# Patient Record
Sex: Male | Born: 1995 | Race: Black or African American | Hispanic: No | Marital: Single | Smoking: Heavy tobacco smoker
Health system: Southern US, Community
[De-identification: ages and names within clinical notes are randomized; demographics above are authoritative.]

## PROBLEM LIST (undated history)

## (undated) ENCOUNTER — Emergency Department (HOSPITAL_COMMUNITY): Discharge status: Against Medical Advice | Payer: BC Managed Care – PPO

## (undated) VITALS — BP 122/85 | HR 105 | Temp 98.1°F | Resp 16 | Ht 66.34 in | Wt 196.2 lb

## (undated) VITALS — BP 106/73 | HR 93 | Temp 98.1°F | Resp 14 | Ht 65.95 in | Wt 147.7 lb

## (undated) VITALS — BP 113/79 | HR 86 | Temp 97.7°F | Resp 16 | Ht 66.34 in | Wt 177.5 lb

## (undated) DIAGNOSIS — F913 Oppositional defiant disorder: Secondary | ICD-10-CM

## (undated) DIAGNOSIS — F329 Major depressive disorder, single episode, unspecified: Secondary | ICD-10-CM

## (undated) DIAGNOSIS — N189 Chronic kidney disease, unspecified: Secondary | ICD-10-CM

## (undated) DIAGNOSIS — F419 Anxiety disorder, unspecified: Secondary | ICD-10-CM

## (undated) DIAGNOSIS — F32A Depression, unspecified: Secondary | ICD-10-CM

## (undated) DIAGNOSIS — F209 Schizophrenia, unspecified: Secondary | ICD-10-CM

## (undated) DIAGNOSIS — R51 Headache: Secondary | ICD-10-CM

## (undated) HISTORY — DX: Chronic kidney disease, unspecified: N18.9

## (undated) HISTORY — DX: Oppositional defiant disorder: F91.3

## (undated) HISTORY — DX: Depression, unspecified: F32.A

## (undated) HISTORY — DX: Major depressive disorder, single episode, unspecified: F32.9

## (undated) HISTORY — DX: Anxiety disorder, unspecified: F41.9

## (undated) HISTORY — PX: URETEROPLASTY: SUR1409

## (undated) HISTORY — DX: Schizophrenia, unspecified: F20.9

## (undated) HISTORY — PX: OTHER SURGICAL HISTORY: SHX169

---
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
## 2015-01-01 ENCOUNTER — Telehealth (HOSPITAL_COMMUNITY): Payer: Self-pay | Admitting: *Deleted

## 2015-01-01 NOTE — Telephone Encounter (Signed)
voice message from mother of patient.   They need a referral from provider in order for Luis Jones to be able to attend a day program.

## 2015-01-01 NOTE — Telephone Encounter (Signed)
Pt mother called and left message for Dr. [name] to call her back to discuss recent issues about pt. Lm for mother to call office back. Number provided.

## 2015-01-05 NOTE — Telephone Encounter (Signed)
Spoke with pt mother and she stated she did not want a ref. Pt is already in a program to help him with not using drugs. Per pt mother, she just needed Dr. [name] to be aware of what pt was doing due to pt not telling the truth when he comes to his f/u with Dr. [name]. Informed pt mother that message was sent earlier to Dr. [name] and she showed understanding

## 2015-01-12 ENCOUNTER — Ambulatory Visit (INDEPENDENT_AMBULATORY_CARE_PROVIDER_SITE_OTHER): Payer: 59 | Admitting: Psychiatry

## 2015-01-12 ENCOUNTER — Encounter (HOSPITAL_COMMUNITY): Payer: Self-pay | Admitting: Psychiatry

## 2015-01-12 DIAGNOSIS — F251 Schizoaffective disorder, depressive type: Secondary | ICD-10-CM

## 2015-01-12 NOTE — Progress Notes (Signed)
          THERAPIST PROGRESS NOTE  Session Time: Monday 01/12/2015  2:15 PM - 3:10 PM  Participation Level: Active  Behavioral Response: Fairly GroomedAlert/angry, depressed  Type of Therapy: Individual Therapy  Treatment Goals addressed:  Improve coping and relaxation techniques, improve efforts regarding self-care  Interventions: Supportive  Summary: Luis Jones is a 19 y.o. male who presents with a history of depression, anxiety, psychotic symptoms,and explosive violent outbursts since April 2013. He's had five psychiatric hospitalizations since that time with the most recent one occuring in December 2014. Most have been preceded by patient becoming very agitated, violent, and experiencing visual and auditory hallucinations (some have been command hallucinations). He also has a history of polysubstance abuse/dependence   Patient last was seen in June 2016. He reports increased stress and depressed mood in recent weeks. He says he and mother had an argument last week about patient not doing more things around the house and mother said very hurtful things. He reports becoming very angry and making disrespectful comments to mother who informed his father about his behavior. Patient now reports tension in the household and not wanting to be around parents for right now. He is contemplating talking to them about staying with his grandfather for a few days. He admits being very hurt by mother's comments. He reports increased auditory hallucinations but denies any command hallucinations. He denies any suicidal ideations but admits he had thoughts of cutting self but was able to refrain. He still likes being in the mentor program and being around his brother. He reports very little involvement in other activities. He still has erratic sleep schedule. He reports he has been trying to get involved in his music and write more songs.  Suicidal/Homicidal: No  Therapist Response: Therapist works  with patient to identify and verbalize feelings, identify underlying feelings beneath anger, review relaxation techniques, identify ways to express concerns to parents and do a role play, identify areas patient can change, identify ways to increase activity and structure, identify ways to improve sleep hygiene,  Plan: Return again in 3-4  Weeks. Patient agrees to use daily planner and bring forms to next session.   Diagnosis: Axis I: Schizoaffective Disorder    Axis II: No diagnosis    Luis Saks, LCSW 01/12/2015

## 2015-01-12 NOTE — Patient Instructions (Signed)
Discussed orally 

## 2015-02-04 ENCOUNTER — Ambulatory Visit (HOSPITAL_COMMUNITY): Payer: Self-pay | Admitting: Psychiatry

## 2015-02-09 ENCOUNTER — Ambulatory Visit (HOSPITAL_COMMUNITY): Payer: Self-pay | Admitting: Psychiatry

## 2015-02-27 ENCOUNTER — Ambulatory Visit (INDEPENDENT_AMBULATORY_CARE_PROVIDER_SITE_OTHER): Payer: 59 | Admitting: Psychiatry

## 2015-02-27 ENCOUNTER — Encounter (HOSPITAL_COMMUNITY): Payer: Self-pay | Admitting: Psychiatry

## 2015-02-27 VITALS — BP 136/74 | HR 93 | Ht 67.0 in | Wt 236.4 lb

## 2015-02-27 DIAGNOSIS — F251 Schizoaffective disorder, depressive type: Secondary | ICD-10-CM | POA: Diagnosis not present

## 2015-02-27 MED ORDER — GABAPENTIN 400 MG PO CAPS: ORAL_CAPSULE | ORAL | Status: DC

## 2015-02-27 MED ORDER — HYDROXYZINE HCL 25 MG PO TABS: 25.0000 mg | ORAL_TABLET | Freq: Every day | ORAL | Status: DC

## 2015-02-27 MED ORDER — OLANZAPINE 10 MG PO TABS: 10.0000 mg | ORAL_TABLET | Freq: Every day | ORAL | Status: DC

## 2015-02-27 MED ORDER — VENLAFAXINE HCL ER 150 MG PO CP24: 150.0000 mg | ORAL_CAPSULE | Freq: Every day | ORAL | Status: DC

## 2015-02-27 MED ORDER — TRAZODONE HCL 100 MG PO TABS: ORAL_TABLET | ORAL | Status: DC
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
## 2015-05-01 ENCOUNTER — Ambulatory Visit (HOSPITAL_COMMUNITY): Payer: Self-pay | Admitting: Psychiatry

## 2015-05-05 ENCOUNTER — Telehealth (HOSPITAL_COMMUNITY): Payer: Self-pay | Admitting: *Deleted

## 2015-06-05 ENCOUNTER — Ambulatory Visit (INDEPENDENT_AMBULATORY_CARE_PROVIDER_SITE_OTHER): Payer: 59 | Admitting: Psychiatry

## 2015-06-05 ENCOUNTER — Encounter (HOSPITAL_COMMUNITY): Payer: Self-pay | Admitting: Psychiatry

## 2015-06-05 DIAGNOSIS — F251 Schizoaffective disorder, depressive type: Secondary | ICD-10-CM
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
## 2015-06-05 NOTE — Patient Instructions (Signed)
Discussed orally 

## 2015-06-12 ENCOUNTER — Ambulatory Visit (HOSPITAL_COMMUNITY): Payer: Self-pay | Admitting: Psychiatry

## 2015-06-13 ENCOUNTER — Other Ambulatory Visit (HOSPITAL_COMMUNITY): Payer: Self-pay | Admitting: Psychiatry

## 2015-06-15 ENCOUNTER — Telehealth (HOSPITAL_COMMUNITY): Payer: Self-pay | Admitting: *Deleted

## 2015-06-15 ENCOUNTER — Other Ambulatory Visit (HOSPITAL_COMMUNITY): Payer: Self-pay | Admitting: Psychiatry

## 2015-06-15 MED ORDER — HYDROXYZINE HCL 25 MG PO TABS: 25.0000 mg | ORAL_TABLET | Freq: Every day | ORAL | Status: DC

## 2015-06-15 MED ORDER — VENLAFAXINE HCL ER 150 MG PO CP24: 150.0000 mg | ORAL_CAPSULE | Freq: Every day | ORAL | Status: DC

## 2015-06-15 MED ORDER — TRAZODONE HCL 100 MG PO TABS: ORAL_TABLET | ORAL | Status: DC

## 2015-06-15 MED ORDER — OLANZAPINE 10 MG PO TABS: 10.0000 mg | ORAL_TABLET | Freq: Every day | ORAL | Status: DC

## 2015-06-15 MED ORDER — GABAPENTIN 400 MG PO CAPS: ORAL_CAPSULE | ORAL | Status: DC

## 2015-06-15 NOTE — Telephone Encounter (Signed)
sent 

## 2015-06-15 NOTE — Telephone Encounter (Signed)
Pt mother called stating pt is out of all of his medications. Pt Gabapentin, Hydroxyzine, Olanzapine, Trazodone and Venlafaxine were last filled 02-27-15. Pt appt for 05-01-15 was cancelled and pt f/u appt is scheduled for 06-18-15. Pt number is [phone].

## 2015-06-15 NOTE — Telephone Encounter (Signed)
patient missed appointment on 06/12/15.    phone call from patient's mom.   He is out of his meds.   He need refill of all medications.

## 2015-06-15 NOTE — Telephone Encounter (Signed)
completed

## 2015-06-15 NOTE — Telephone Encounter (Signed)
noted 

## 2015-06-17 ENCOUNTER — Telehealth (HOSPITAL_COMMUNITY): Payer: Self-pay | Admitting: *Deleted

## 2015-06-18 ENCOUNTER — Ambulatory Visit (HOSPITAL_COMMUNITY): Payer: Self-pay | Admitting: Psychiatry

## 2015-07-03 ENCOUNTER — Ambulatory Visit (INDEPENDENT_AMBULATORY_CARE_PROVIDER_SITE_OTHER): Payer: 59 | Admitting: Psychiatry

## 2015-07-03 ENCOUNTER — Encounter (HOSPITAL_COMMUNITY): Payer: Self-pay | Admitting: Psychiatry

## 2015-07-03 DIAGNOSIS — F251 Schizoaffective disorder, depressive type: Secondary | ICD-10-CM
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
## 2015-07-03 NOTE — Patient Instructions (Signed)
Discussed orally 

## 2015-07-10 ENCOUNTER — Encounter (HOSPITAL_COMMUNITY): Payer: Self-pay | Admitting: Psychiatry

## 2015-07-10 ENCOUNTER — Ambulatory Visit (INDEPENDENT_AMBULATORY_CARE_PROVIDER_SITE_OTHER): Payer: 59 | Admitting: Psychiatry

## 2015-07-10 VITALS — BP 132/70 | HR 69 | Ht 67.03 in | Wt 228.8 lb

## 2015-07-10 DIAGNOSIS — F251 Schizoaffective disorder, depressive type: Secondary | ICD-10-CM | POA: Diagnosis not present

## 2015-07-10 MED ORDER — HYDROXYZINE HCL 25 MG PO TABS: 25.0000 mg | ORAL_TABLET | Freq: Every day | ORAL | Status: DC

## 2015-07-10 MED ORDER — OLANZAPINE 10 MG PO TABS: 10.0000 mg | ORAL_TABLET | Freq: Every day | ORAL | Status: DC

## 2015-07-10 MED ORDER — TRAZODONE HCL 100 MG PO TABS: ORAL_TABLET | ORAL | Status: DC

## 2015-07-10 MED ORDER — GABAPENTIN 400 MG PO CAPS: ORAL_CAPSULE | ORAL | Status: DC

## 2015-07-10 MED ORDER — VENLAFAXINE HCL ER 150 MG PO CP24: 150.0000 mg | ORAL_CAPSULE | Freq: Every day | ORAL | Status: DC
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
## 2015-07-31 ENCOUNTER — Encounter (HOSPITAL_COMMUNITY): Payer: Self-pay | Admitting: Psychiatry

## 2015-07-31 ENCOUNTER — Ambulatory Visit (INDEPENDENT_AMBULATORY_CARE_PROVIDER_SITE_OTHER): Payer: 59 | Admitting: Psychiatry

## 2015-07-31 DIAGNOSIS — F251 Schizoaffective disorder, depressive type: Secondary | ICD-10-CM | POA: Diagnosis not present

## 2015-07-31 NOTE — Patient Instructions (Signed)
Discussed orally 
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
## 2015-08-14 ENCOUNTER — Ambulatory Visit (HOSPITAL_COMMUNITY): Payer: Self-pay | Admitting: Psychiatry

## 2015-09-04 ENCOUNTER — Ambulatory Visit (HOSPITAL_COMMUNITY): Payer: Self-pay | Admitting: Psychiatry

## 2015-09-07 ENCOUNTER — Other Ambulatory Visit (HOSPITAL_COMMUNITY): Payer: Self-pay | Admitting: Psychiatry

## 2015-09-18 ENCOUNTER — Encounter (HOSPITAL_COMMUNITY): Payer: Self-pay | Admitting: Psychiatry

## 2015-09-18 ENCOUNTER — Ambulatory Visit (INDEPENDENT_AMBULATORY_CARE_PROVIDER_SITE_OTHER): Payer: 59 | Admitting: Psychiatry

## 2015-09-18 VITALS — BP 110/60 | Ht 67.0 in | Wt 224.0 lb

## 2015-09-18 DIAGNOSIS — F251 Schizoaffective disorder, depressive type: Secondary | ICD-10-CM | POA: Diagnosis not present

## 2015-09-18 MED ORDER — OLANZAPINE 10 MG PO TABS: 10.0000 mg | ORAL_TABLET | Freq: Every day | ORAL | Status: DC

## 2015-09-18 MED ORDER — HYDROXYZINE HCL 25 MG PO TABS: 25.0000 mg | ORAL_TABLET | Freq: Every day | ORAL | Status: DC

## 2015-09-18 MED ORDER — VENLAFAXINE HCL ER 150 MG PO CP24: 150.0000 mg | ORAL_CAPSULE | Freq: Every day | ORAL | Status: DC

## 2015-09-18 MED ORDER — GABAPENTIN 400 MG PO CAPS: ORAL_CAPSULE | ORAL | Status: DC

## 2015-09-18 MED ORDER — TRAZODONE HCL 100 MG PO TABS: ORAL_TABLET | ORAL | Status: DC
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
## 2015-11-03 ENCOUNTER — Ambulatory Visit (INDEPENDENT_AMBULATORY_CARE_PROVIDER_SITE_OTHER): Payer: 59 | Admitting: Psychiatry

## 2015-11-03 ENCOUNTER — Emergency Department (HOSPITAL_COMMUNITY)
Admission: EM | Admit: 2015-11-03 | Discharge: 2015-11-04 | Disposition: A | Discharge status: Home / Self Care | Payer: 59 | Attending: Emergency Medicine | Admitting: Emergency Medicine

## 2015-11-03 ENCOUNTER — Telehealth (HOSPITAL_COMMUNITY): Payer: Self-pay | Admitting: *Deleted

## 2015-11-03 ENCOUNTER — Encounter (HOSPITAL_COMMUNITY): Payer: Self-pay | Admitting: Psychiatry

## 2015-11-03 ENCOUNTER — Encounter (HOSPITAL_COMMUNITY): Payer: Self-pay | Admitting: Emergency Medicine

## 2015-11-03 VITALS — BP 147/70 | HR 74 | Ht 67.0 in | Wt 232.0 lb

## 2015-11-03 DIAGNOSIS — F259 Schizoaffective disorder, unspecified: Secondary | ICD-10-CM | POA: Diagnosis present

## 2015-11-03 DIAGNOSIS — R45851 Suicidal ideations: Secondary | ICD-10-CM

## 2015-11-03 DIAGNOSIS — Z79899 Other long term (current) drug therapy: Secondary | ICD-10-CM | POA: Insufficient documentation

## 2015-11-03 DIAGNOSIS — F17210 Nicotine dependence, cigarettes, uncomplicated: Secondary | ICD-10-CM | POA: Insufficient documentation

## 2015-11-03 DIAGNOSIS — F209 Schizophrenia, unspecified: Secondary | ICD-10-CM | POA: Insufficient documentation

## 2015-11-03 DIAGNOSIS — F251 Schizoaffective disorder, depressive type: Secondary | ICD-10-CM | POA: Diagnosis not present

## 2015-11-03 DIAGNOSIS — F329 Major depressive disorder, single episode, unspecified: Secondary | ICD-10-CM | POA: Insufficient documentation

## 2015-11-03 DIAGNOSIS — N189 Chronic kidney disease, unspecified: Secondary | ICD-10-CM | POA: Diagnosis not present

## 2015-11-03 LAB — COMPREHENSIVE METABOLIC PANEL
ALT: 30 U/L (ref 17–63)
ANION GAP: 10 (ref 5–15)
AST: 26 U/L (ref 15–41)
Albumin: 4 g/dL (ref 3.5–5.0)
Alkaline Phosphatase: 93 U/L (ref 38–126)
BUN: 12 mg/dL (ref 6–20)
CALCIUM: 8.9 mg/dL (ref 8.9–10.3)
CHLORIDE: 108 mmol/L (ref 101–111)
CO2: 23 mmol/L (ref 22–32)
Creatinine, Ser: 1.58 mg/dL — ABNORMAL HIGH (ref 0.61–1.24)
GFR calc non Af Amer: >60 mL/min (ref >60)
Glucose, Bld: 89 mg/dL (ref 65–99)
Potassium: 3.9 mmol/L (ref 3.5–5.1)
SODIUM: 141 mmol/L (ref 135–145)
Total Bilirubin: 1 mg/dL (ref 0.3–1.2)
Total Protein: 7.5 g/dL (ref 6.5–8.1)

## 2015-11-03 LAB — ACETAMINOPHEN LEVEL

## 2015-11-03 LAB — CBC
HCT: 40.1 % (ref 39.0–52.0)
HEMOGLOBIN: 13.9 g/dL (ref 13.0–17.0)
MCH: 31.7 pg (ref 26.0–34.0)
MCHC: 34.7 g/dL (ref 30.0–36.0)
MCV: 91.3 fL (ref 78.0–100.0)
PLATELETS: 348 10*3/uL (ref 150–400)
RBC: 4.39 MIL/uL (ref 4.22–5.81)
RDW: 12.3 % (ref 11.5–15.5)
WBC: 9.8 10*3/uL (ref 4.0–10.5)

## 2015-11-03 LAB — RAPID URINE DRUG SCREEN, HOSP PERFORMED
AMPHETAMINES: NOT DETECTED
BARBITURATES: NOT DETECTED
BENZODIAZEPINES: NOT DETECTED
Cocaine: NOT DETECTED
OPIATES: NOT DETECTED
Tetrahydrocannabinol: NOT DETECTED

## 2015-11-03 LAB — ETHANOL

## 2015-11-03 LAB — SALICYLATE LEVEL

## 2015-11-03 MED ORDER — TRAZODONE HCL 50 MG PO TABS
200.0000 mg | ORAL_TABLET | Freq: Every day | ORAL | Status: DC
  Administered 2015-11-03: 200 mg via ORAL
  Filled 2015-11-03: qty 4

## 2015-11-03 MED ORDER — OLANZAPINE 5 MG PO TABS
10.0000 mg | ORAL_TABLET | Freq: Every day | ORAL | Status: DC
  Administered 2015-11-03: 10 mg via ORAL
  Filled 2015-11-03: qty 2

## 2015-11-03 MED ORDER — GABAPENTIN 400 MG PO CAPS
800.0000 mg | ORAL_CAPSULE | Freq: Every day | ORAL | Status: DC
  Administered 2015-11-03: 800 mg via ORAL
  Filled 2015-11-03: qty 2

## 2015-11-03 MED ORDER — VENLAFAXINE HCL ER 37.5 MG PO CP24
150.0000 mg | ORAL_CAPSULE | Freq: Every day | ORAL | Status: DC
  Administered 2015-11-03 – 2015-11-04 (×2): 150 mg via ORAL
  Filled 2015-11-03 (×2): qty 4

## 2015-11-03 MED ORDER — HYDROXYZINE HCL 25 MG PO TABS
25.0000 mg | ORAL_TABLET | Freq: Every day | ORAL | Status: DC
  Administered 2015-11-03: 25 mg via ORAL
  Filled 2015-11-03: qty 1

## 2015-11-03 MED ORDER — LISINOPRIL 10 MG PO TABS
20.0000 mg | ORAL_TABLET | Freq: Every day | ORAL | Status: DC
  Administered 2015-11-03 – 2015-11-04 (×2): 20 mg via ORAL
  Filled 2015-11-03 (×2): qty 2

## 2015-11-03 MED ORDER — ATORVASTATIN CALCIUM 20 MG PO TABS
20.0000 mg | ORAL_TABLET | Freq: Every day | ORAL | Status: DC
  Administered 2015-11-03: 20 mg via ORAL
  Filled 2015-11-03 (×3): qty 1

## 2015-11-03 NOTE — Telephone Encounter (Signed)
Per Verbal from Dr. Tenny Crawoss to call pt brother (who brought pt to his appt) to [facility]. Per Dr. Tenny Crawoss to call [facility] and let them know that pt is on their way. Spoke with charge nurse Judeth CornfieldStephanie and she showed understanding.

## 2015-11-03 NOTE — ED Notes (Signed)
PT states SI thoughts with no plan that started x2 days ago. PT states he has been taking his medications as prescribed and was sent from Dr. Tenny Crawoss office for ED evaluation. PT also states he hears voices but can't understand what they are saying to him.
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
## 2015-11-03 NOTE — BH Assessment (Signed)
Contacted [facility] and requested machine be placed in patients room. Tele-assessment to commence shortly.   Davina PokeJoVea Nicasio Barlowe, LCSW Therapeutic Triage Specialist [facility] 11/03/2015 7:36 PM

## 2015-11-03 NOTE — ED Provider Notes (Signed)
CSN: [id number]     Arrival date & time 11/03/15  1644 History   First MD Initiated Contact with Patient 11/03/15 1717     Chief Complaint  Patient presents with  . V70.1   HPI Patient presents to the emergency room for evaluation of suicidal ideation. Patient has a history of anxiety and and depression as well as schizophrenia and oppositional defiant disorder. He has had thoughts of suicide for the last couple of days. Patient denies any specific plan and denies any recent attempts. Patient also states he is hearing voices but they not commanding him to do anything. He can't really understand what they're saying. Patient saw his doctor today who sent him to the ED for evaluation. Past Medical History  Diagnosis Date  . Anxiety   . Oppositional defiant disorder   . Depression   . Chronic kidney disease   . Headache(784.0)   . Schizophrenia [facility](HCC)    Past Surgical History  Procedure Laterality Date  . 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 Kideny surgery     Family History  Problem Relation Age of Onset  . Bipolar disorder Mother   . Anxiety disorder Mother   . Seizures Mother   . Autism spectrum disorder Brother   . Drug abuse Maternal Aunt   . Depression Maternal Aunt   . Depression Maternal Uncle   . Bipolar disorder Paternal Aunt   . OCD Father   . ADD / ADHD Neg Hx   . Alcohol abuse Neg Hx   . Dementia Neg Hx   . Paranoid behavior Neg Hx   . Schizophrenia Neg Hx   . Sexual abuse Neg Hx   . Physical abuse Neg Hx    Social History  Substance Use Topics  . Smoking status: Current Every Day Smoker -- 0.25 packs/day    Types: Cigarettes  . Smokeless tobacco: Current User    Types: Chew     Comment: 1 can last 1-2 days as of 09/06/2012  . Alcohol Use: No     Comment: only  drinks occasionally    Review of Systems  All other systems reviewed and are negative.     Allergies  Wellbutrin and Ibuprofen  Home Medications   Prior to Admission  medications   Medication Sig Start Date End Date Taking? Authorizing Provider  atorvastatin (LIPITOR) 20 MG tablet Take 20 mg by mouth daily.    Historical Provider, MD  Ergocalciferol (VITAMIN D2 PO) Take 1.25 mg by mouth as directed.    Historical Provider, MD  gabapentin (NEURONTIN) 400 MG capsule Take 2 at bedtime 09/18/15   Myrlene Brokereborah R Ross, MD  hydrOXYzine (ATARAX/VISTARIL) 25 MG tablet Take 1 tablet (25 mg total) by mouth at bedtime. 09/18/15   Myrlene Brokereborah R Ross, MD  lisinopril (PRINIVIL,ZESTRIL) 20 MG tablet Take 20 mg by mouth daily.    Historical Provider, MD  OLANZapine (ZYPREXA) 10 MG tablet Take 1 tablet (10 mg total) by mouth at bedtime. 09/18/15   Myrlene Brokereborah R Ross, MD  traZODone (DESYREL) 100 MG tablet two at bedtime 09/18/15   Myrlene Brokereborah R Ross, MD  venlafaxine XR (EFFEXOR-XR) 150 MG 24 hr capsule Take 1 capsule (150 mg total) by mouth daily. 09/18/15 09/17/16  Myrlene Brokereborah R Ross, MD   BP 144/75 mmHg  Pulse 71  Temp(Src) 98.3 F (36.8 C) (Oral)  Resp 18  Ht 5\' 7"  (1.702 m)  Wt 105.688 kg  BMI 36.48 kg/m2  SpO2 98% Physical Exam  Constitutional: He appears well-developed and well-nourished. No distress.  HENT:  Head: Normocephalic and atraumatic.  Right Ear: External ear normal.  Left Ear: External ear normal.  Eyes: Conjunctivae are normal. Right eye exhibits no discharge. Left eye exhibits no discharge. No scleral icterus.  Neck: Neck supple. No tracheal deviation present.  Cardiovascular: Normal rate, regular rhythm and intact distal pulses.   Pulmonary/Chest: Effort normal and breath sounds normal. No stridor. No respiratory distress. He has no wheezes. He has no rales.  Abdominal: Soft. Bowel sounds are normal. He exhibits no distension. There is no tenderness. There is no rebound and no guarding.  Musculoskeletal: He exhibits no edema or tenderness.  Neurological: He is alert. He has normal strength. No cranial nerve deficit (no facial droop, extraocular movements intact, no slurred  speech) or sensory deficit. He exhibits normal muscle tone. He displays no seizure activity. Coordination normal.  Skin: Skin is warm and dry. No rash noted.  Psychiatric: He exhibits a depressed mood. He expresses suicidal ideation. He expresses no suicidal plans and no homicidal plans.  Nursing note and vitals reviewed.   ED Course  Procedures (including critical care time) Labs Review Labs Reviewed  COMPREHENSIVE METABOLIC PANEL - Abnormal; Notable for the following:    Creatinine, Ser 1.58 (*)    All other components within normal limits  ACETAMINOPHEN LEVEL - Abnormal; Notable for the following:    Acetaminophen (Tylenol), Serum <10 (*)    All other components within normal limits  ETHANOL  SALICYLATE LEVEL  CBC  URINE RAPID DRUG SCREEN, HOSP PERFORMED      MDM   Final diagnoses:  Suicidal ideation    Pt presents with complaints of SI.   Pt is calm and cooperative here. Hemodynamicaly stable without acute medical issues. Will consult with psychiatry/TTS    Linwood Dibbles, MD 11/03/15 1820

## 2015-11-03 NOTE — Telephone Encounter (Signed)
Per phone call from pt mother to sch appt due to pt experiencing a lot more depression. Pt is scheduled for 11-03-15

## 2015-11-03 NOTE — ED Notes (Signed)
AC aware lipitor dose not available in ER pyxis. AC reported would bring dose.
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
## 2015-11-03 NOTE — ED Notes (Signed)
Call from Assessment counselor, she has spoken to the PA who suggests that pt be observed throughout the night and be assessed by psychiatry in the morning.

## 2015-11-03 NOTE — ED Notes (Signed)
Behavioral health assessment is completed

## 2015-11-03 NOTE — BH Assessment (Addendum)
Assessment completed. Consulted with Donell SievertSpencer Simon, PA-C who recommends patient be observed overnight and evaluated by psychiatry in the morning. Patients nurse informed of recommendation.   Davina PokeJoVea Tiny Chaudhary, LCSW Therapeutic Triage Specialist [facility] 11/03/2015 8:41 PM

## 2015-11-04 DIAGNOSIS — R45851 Suicidal ideations: Secondary | ICD-10-CM | POA: Diagnosis not present

## 2015-11-04 DIAGNOSIS — F259 Schizoaffective disorder, unspecified: Secondary | ICD-10-CM

## 2015-11-04 MED ORDER — DIVALPROEX SODIUM 500 MG PO DR TAB: 500.0000 mg | DELAYED_RELEASE_TABLET | Freq: Two times a day (BID) | ORAL | Status: DC

## 2015-11-04 MED ORDER — OLANZAPINE 5 MG PO TABS
7.5000 mg | ORAL_TABLET | Freq: Two times a day (BID) | ORAL | Status: DC
  Administered 2015-11-04: 7.5 mg via ORAL
  Filled 2015-11-04: qty 2

## 2015-11-04 MED ORDER — OLANZAPINE 7.5 MG PO TABS
7.5000 mg | ORAL_TABLET | Freq: Two times a day (BID) | ORAL | Status: DC
Start: 2015-11-04 — End: 2016-01-11

## 2015-11-04 MED ORDER — DIVALPROEX SODIUM 250 MG PO DR TAB
500.0000 mg | DELAYED_RELEASE_TABLET | Freq: Two times a day (BID) | ORAL | Status: DC
  Administered 2015-11-04: 500 mg via ORAL
  Filled 2015-11-04: qty 2

## 2015-11-04 NOTE — Discharge Instructions (Signed)
Substance Abuse Treatment Programs ° °Intensive Outpatient Programs °[facility]     °[address]      °[place], [place]                   °[phone]      ° °[facility] °[address] °[place], [place] °[phone] ° °[facility]     °(Inpatient and outpatient)     °[address]           °[phone]   ° °[facility] °[phone] (Suboxone and Methadone) ° °[address]      °[place], [place] [address]      °[phone]      ° °[address] °[place], [place] °852-3033 ° °[facility] (Outpatient/Inpatient, [facility])    °(insurance only) [phone]      °       °[facility] (Groups & Residential) °[place], [place] °[phone] ° °   °[facility]     °[address]     °[place], [place]      °[phone]      ° °[facility] (for caregivers and family) °[address] °[place], [place] °336-299-4655 ° ° ° ° ° °Residential Treatment Programs °[facility]      °[address], [place], [place] [address]  °([phone]      ° °[facility] °[address] [place], [place] [address] °[phone] ° °[facility]        °336-248-8914      ° °[facility] °[phone] ° °[facility]             °[address]                                         °[place], [place]                                                °877-615-2722 or 336-784-9470                              ° °[facility] [place] °[address] °[place], [address] °[phone] ° °[facility]    °[address]      °[place], [place]     °[phone]      ° °[facility] °[address] °[place], [place] °336-285-9073 ° °[facility]   °[address]     °[place], [place] [address]     °[phone]      °Admissions: 8am-3pm M-F ° °[facility] °[address] °[place] °336-227-7417 ° °[facility]: Residential Program (90 Days)   °[place], [place]      °336-725-8389 or 800-758-6077    ° °[facility] °[place], [place] °(Walk in Hours over the weekend or by referral) ° °[facility] °[address], [place],  [address] °([phone] ° °Crisis Mobile: [facility]:  1-877-626-1772 (for crisis response 24 hours a day) °[facility] Hotline:      1-800-256-2452 °Outpatient Psychiatry and Counseling ° °[facility]: Mobile Crisis   Management 24 hours:  1-877-626-1772 ° °[facility] sliding scale fee and walk in schedule: M-F 8am-12pm/1pm-3pm °[address]  °[place], [place] [address] °[phone] ° °[facility] °[address] °[place], [place] [address] °[phone] ° °[facility] ([facility])- new patient walk-in appointments available Monday - Friday 8am -3pm.          °[address] °[place], [place] [address] °336-676-6840 or crisis line- 336-676-6905 ° °[facility] Intensive Outpatient Therapy Program °[address] °[place], [place] [address] °336-832-9804 ° °[facility]                  °Crisis Services      °[phone]      °[address]     °[place], [place] [address]                ° °[facility]   °[facility] °[phone] °[address] °[place], [place] [address] ° ° °[facility]          °[address]  °[place], [place] [address]       °([phone] ° °[facility] °[address] °[place], [place] [address] °336-292-1510 ° °[facility]    °[address]    °[place], [place] [address]     °[phone]      ° °Parish McKinney, MD     °[address]     °[place] [address]     °336-282-1251     °  °[facility] °[address] °[place] [address] ° °[facility]     °[address]     °[place], [place]      °336-542-2076      ° °[facility] °Shamsher Ahluwalia, MD °[address] °[place], [place] [address] °[phone] ° °[facility]     °[address]     °[place], [place] [address]     °336-274-1237      ° °[facility] °[address] °[place], [place] [address] °336-854-4450 °Resources for Temporary Residential Assistance/Crisis Centers ° °DAY CENTERS °[facility] °M-F 8am-3pm   °[address] [place], [place] [address]   336-332-0824 °Services include: laundry, barbering, support groups, case management, phone  & computer access, showers, AA/NA mtgs, mental health/substance abuse nurse, job skills class, disability information, VA assistance, spiritual classes, etc.  ° °HOMELESS SHELTERS ° °[facility]     °[facility]   °[address], [place]     °336.271.5959       °       °[facility] (women and children)       °[address] °[place], [place] [address] °336-275-0820 °[email or web address] for application and process °Application Required ° °[facility]   °[address]    °[place] [address]     °[phone]       °             °[facility] °[address] °[place], [place] [address] °336.273.5572 °336-235-0363(schedule application appt.) °Application Required ° °[facility] (women only)    °[address]     °[place], [place] [address]     °336-884-1039      °  Intake starts 6pm daily °Need valid ID, SSC, & Police report °[facility] [place] °[address] °[place], [place] °336-881-5420 °Application Required ° °[facility] (men only)     °[address]      °[place], [place]     °[phone]      ° °[facility] °(Pregnant women only) °[address] °[place], [place] °336-275-0206 ° °[facility]      °[address]      °[place], [place] [address]     °[phone]      °       °[facility] °[address] °[place], [place] °336-723-1848 °90 day commitment/SA/Application process ° °[facility](men only)     °[address]     °[place], [place]     °[phone]       °Check-in at 7pm     °       °[facility] °[address] °[place], [place] [address] °336-248-6684 °Men/Women/Women and Children must be there by 7 pm ° °[facility] °[place], [place] °336-722-8721                ° °
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
## 2015-11-04 NOTE — ED Notes (Signed)
Patient's mother called and stated that she would not be able to pick up [facility] until 6pm. I informed patient and told him it would be ok for him to wait in the lobby until then. I offered patient something to drink and eat before exiting to lobby. Patient had a lunch tray before discharge.  Security informed of patient waiting in lobby.

## 2015-11-19 ENCOUNTER — Ambulatory Visit (HOSPITAL_COMMUNITY): Payer: Self-pay | Admitting: Psychiatry

## 2015-12-16 ENCOUNTER — Ambulatory Visit (HOSPITAL_COMMUNITY): Payer: Self-pay | Admitting: Psychiatry

## 2015-12-17 ENCOUNTER — Ambulatory Visit (HOSPITAL_COMMUNITY): Payer: Self-pay | Admitting: Psychiatry

## 2016-01-11 ENCOUNTER — Other Ambulatory Visit (HOSPITAL_COMMUNITY): Payer: Self-pay | Admitting: Psychiatry

## 2016-01-15 ENCOUNTER — Other Ambulatory Visit (HOSPITAL_COMMUNITY): Payer: Self-pay | Admitting: Psychiatry

## 2016-01-19 ENCOUNTER — Telehealth (HOSPITAL_COMMUNITY): Payer: Self-pay | Admitting: *Deleted

## 2016-01-19 ENCOUNTER — Other Ambulatory Visit (HOSPITAL_COMMUNITY): Payer: Self-pay | Admitting: Psychiatry

## 2016-01-19 MED ORDER — GABAPENTIN 400 MG PO CAPS
ORAL_CAPSULE | ORAL | 2 refills | Status: DC
Start: 2016-01-19 — End: 2016-02-16

## 2016-01-19 NOTE — Telephone Encounter (Signed)
noted 

## 2016-01-19 NOTE — Telephone Encounter (Signed)
nuerontin sent

## 2016-01-19 NOTE — Telephone Encounter (Signed)
Spoke with pt and informed him refills are at pharmacy and pt verbalized understanding.

## 2016-01-19 NOTE — Telephone Encounter (Signed)
patient need refill of Gabapentin and Trazodone.

## 2016-01-19 NOTE — Telephone Encounter (Signed)
Pt pharmacy requesting refills for pt Trazodone, Olanzapine and Neurontin via e-scribe. Pt Trazodone and Olanzapine last filled on 01-11-2016 with 2 refills but pt Neurontin last filled on 09-18-2015 with 2 refills. Pt f/u appt is 01-27-2016. Pt pharmacy number is [phone].

## 2016-01-27 ENCOUNTER — Encounter (HOSPITAL_COMMUNITY): Payer: Self-pay

## 2016-01-27 ENCOUNTER — Ambulatory Visit (HOSPITAL_COMMUNITY): Payer: Self-pay | Admitting: Psychiatry

## 2016-02-10 ENCOUNTER — Other Ambulatory Visit (HOSPITAL_COMMUNITY): Payer: Self-pay | Admitting: Psychiatry

## 2016-02-16 ENCOUNTER — Encounter (HOSPITAL_COMMUNITY): Payer: Self-pay | Admitting: Psychiatry

## 2016-02-16 ENCOUNTER — Ambulatory Visit (INDEPENDENT_AMBULATORY_CARE_PROVIDER_SITE_OTHER): Payer: 59 | Admitting: Psychiatry

## 2016-02-16 VITALS — BP 144/82 | HR 78 | Ht 67.0 in | Wt 234.0 lb

## 2016-02-16 DIAGNOSIS — F251 Schizoaffective disorder, depressive type: Secondary | ICD-10-CM

## 2016-02-16 MED ORDER — OLANZAPINE 10 MG PO TABS: 10.0000 mg | ORAL_TABLET | Freq: Every day | ORAL | 2 refills | Status: DC

## 2016-02-16 MED ORDER — VENLAFAXINE HCL ER 150 MG PO CP24: 150.0000 mg | ORAL_CAPSULE | Freq: Every day | ORAL | 2 refills | Status: DC

## 2016-02-16 MED ORDER — GABAPENTIN 400 MG PO CAPS: ORAL_CAPSULE | ORAL | 2 refills | Status: DC

## 2016-02-16 MED ORDER — HYDROXYZINE HCL 25 MG PO TABS: 25.0000 mg | ORAL_TABLET | Freq: Every day | ORAL | 2 refills | Status: DC

## 2016-02-16 MED ORDER — TRAZODONE HCL 100 MG PO TABS: 200.0000 mg | ORAL_TABLET | Freq: Every day | ORAL | 2 refills | Status: DC
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
## 2016-04-19 ENCOUNTER — Ambulatory Visit (INDEPENDENT_AMBULATORY_CARE_PROVIDER_SITE_OTHER): Payer: 59 | Admitting: Psychiatry

## 2016-04-19 ENCOUNTER — Encounter (HOSPITAL_COMMUNITY): Payer: Self-pay | Admitting: Psychiatry

## 2016-04-19 VITALS — BP 125/89 | HR 69 | Ht 67.0 in | Wt 233.0 lb

## 2016-04-19 DIAGNOSIS — F251 Schizoaffective disorder, depressive type: Secondary | ICD-10-CM | POA: Diagnosis not present

## 2016-04-19 DIAGNOSIS — Z79899 Other long term (current) drug therapy: Secondary | ICD-10-CM

## 2016-04-19 MED ORDER — TRAZODONE HCL 100 MG PO TABS: 200.0000 mg | ORAL_TABLET | Freq: Every day | ORAL | 2 refills | Status: DC

## 2016-04-19 MED ORDER — GABAPENTIN 400 MG PO CAPS: ORAL_CAPSULE | ORAL | 2 refills | Status: DC

## 2016-04-19 MED ORDER — VENLAFAXINE HCL ER 150 MG PO CP24: 150.0000 mg | ORAL_CAPSULE | Freq: Every day | ORAL | 2 refills | Status: DC

## 2016-04-19 MED ORDER — OLANZAPINE 10 MG PO TABS: 10.0000 mg | ORAL_TABLET | Freq: Every day | ORAL | 2 refills | Status: DC
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
## 2016-05-18 ENCOUNTER — Encounter (HOSPITAL_COMMUNITY): Payer: Self-pay | Admitting: Psychiatry

## 2016-05-18 ENCOUNTER — Encounter (INDEPENDENT_AMBULATORY_CARE_PROVIDER_SITE_OTHER): Payer: Self-pay

## 2016-05-18 ENCOUNTER — Ambulatory Visit (INDEPENDENT_AMBULATORY_CARE_PROVIDER_SITE_OTHER): Payer: 59 | Admitting: Psychiatry

## 2016-05-18 VITALS — BP 137/77 | HR 70 | Ht 67.0 in | Wt 230.4 lb

## 2016-05-18 DIAGNOSIS — F251 Schizoaffective disorder, depressive type: Secondary | ICD-10-CM | POA: Diagnosis not present

## 2016-05-18 DIAGNOSIS — Z79899 Other long term (current) drug therapy: Secondary | ICD-10-CM

## 2016-05-18 MED ORDER — OLANZAPINE 10 MG PO TABS: 10.0000 mg | ORAL_TABLET | Freq: Every day | ORAL | 2 refills | Status: DC

## 2016-05-18 MED ORDER — GABAPENTIN 400 MG PO CAPS: ORAL_CAPSULE | ORAL | 2 refills | Status: DC

## 2016-05-18 MED ORDER — VENLAFAXINE HCL ER 150 MG PO CP24: 150.0000 mg | ORAL_CAPSULE | Freq: Every day | ORAL | 2 refills | Status: DC

## 2016-05-18 MED ORDER — TRAZODONE HCL 100 MG PO TABS: 100.0000 mg | ORAL_TABLET | Freq: Every day | ORAL | 2 refills | Status: DC
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
## 2016-07-01 ENCOUNTER — Ambulatory Visit (INDEPENDENT_AMBULATORY_CARE_PROVIDER_SITE_OTHER): Payer: 59 | Admitting: Psychiatry

## 2016-07-01 ENCOUNTER — Encounter (HOSPITAL_COMMUNITY): Payer: Self-pay | Admitting: Psychiatry

## 2016-07-01 VITALS — BP 164/95 | HR 72 | Ht 67.0 in | Wt 236.0 lb

## 2016-07-01 DIAGNOSIS — F251 Schizoaffective disorder, depressive type: Secondary | ICD-10-CM | POA: Diagnosis not present

## 2016-07-01 DIAGNOSIS — Z79899 Other long term (current) drug therapy: Secondary | ICD-10-CM | POA: Diagnosis not present

## 2016-07-01 MED ORDER — VENLAFAXINE HCL ER 150 MG PO CP24: 150.0000 mg | ORAL_CAPSULE | Freq: Every day | ORAL | 2 refills | Status: DC

## 2016-07-01 MED ORDER — GABAPENTIN 400 MG PO CAPS: ORAL_CAPSULE | ORAL | 2 refills | Status: DC

## 2016-07-01 MED ORDER — OLANZAPINE 10 MG PO TABS: 10.0000 mg | ORAL_TABLET | Freq: Every day | ORAL | 2 refills | Status: DC

## 2016-07-01 MED ORDER — TRAZODONE HCL 100 MG PO TABS: 100.0000 mg | ORAL_TABLET | Freq: Every day | ORAL | 2 refills | Status: DC
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
## 2016-08-26 ENCOUNTER — Encounter (HOSPITAL_COMMUNITY): Payer: Self-pay | Admitting: Psychiatry

## 2016-08-26 ENCOUNTER — Ambulatory Visit (INDEPENDENT_AMBULATORY_CARE_PROVIDER_SITE_OTHER): Payer: 59 | Admitting: Psychiatry

## 2016-08-26 ENCOUNTER — Encounter (INDEPENDENT_AMBULATORY_CARE_PROVIDER_SITE_OTHER): Payer: Self-pay

## 2016-08-26 VITALS — BP 133/72 | HR 80 | Ht 67.0 in | Wt 243.0 lb

## 2016-08-26 DIAGNOSIS — Z810 Family history of intellectual disabilities: Secondary | ICD-10-CM

## 2016-08-26 DIAGNOSIS — Z818 Family history of other mental and behavioral disorders: Secondary | ICD-10-CM | POA: Diagnosis not present

## 2016-08-26 DIAGNOSIS — F251 Schizoaffective disorder, depressive type: Secondary | ICD-10-CM | POA: Diagnosis not present

## 2016-08-26 DIAGNOSIS — Z79899 Other long term (current) drug therapy: Secondary | ICD-10-CM

## 2016-08-26 DIAGNOSIS — Z811 Family history of alcohol abuse and dependence: Secondary | ICD-10-CM

## 2016-08-26 MED ORDER — GABAPENTIN 400 MG PO CAPS: ORAL_CAPSULE | ORAL | 2 refills | Status: DC

## 2016-08-26 MED ORDER — TRAZODONE HCL 100 MG PO TABS: 100.0000 mg | ORAL_TABLET | Freq: Every day | ORAL | 2 refills | Status: DC

## 2016-08-26 MED ORDER — VENLAFAXINE HCL ER 150 MG PO CP24: 300.0000 mg | ORAL_CAPSULE | Freq: Every day | ORAL | 2 refills | Status: DC

## 2016-08-26 MED ORDER — OLANZAPINE 10 MG PO TABS: 15.0000 mg | ORAL_TABLET | Freq: Every day | ORAL | 2 refills | Status: DC
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
## 2016-09-27 ENCOUNTER — Ambulatory Visit (HOSPITAL_COMMUNITY): Payer: Self-pay | Admitting: Psychiatry

## 2016-09-28 ENCOUNTER — Ambulatory Visit (HOSPITAL_COMMUNITY): Payer: Self-pay | Admitting: Psychiatry

## 2016-09-29 ENCOUNTER — Encounter (HOSPITAL_COMMUNITY): Payer: Self-pay | Admitting: Psychiatry

## 2016-09-29 ENCOUNTER — Ambulatory Visit (INDEPENDENT_AMBULATORY_CARE_PROVIDER_SITE_OTHER): Payer: 59 | Admitting: Psychiatry

## 2016-09-29 VITALS — BP 139/84 | HR 72 | Ht 67.0 in | Wt 250.0 lb

## 2016-09-29 DIAGNOSIS — F251 Schizoaffective disorder, depressive type: Secondary | ICD-10-CM | POA: Diagnosis not present

## 2016-09-29 DIAGNOSIS — Z79899 Other long term (current) drug therapy: Secondary | ICD-10-CM

## 2016-09-29 MED ORDER — GABAPENTIN 400 MG PO CAPS: ORAL_CAPSULE | ORAL | 2 refills | Status: DC

## 2016-09-29 MED ORDER — OLANZAPINE 10 MG PO TABS: 15.0000 mg | ORAL_TABLET | Freq: Every day | ORAL | 2 refills | Status: DC

## 2016-09-29 MED ORDER — TRAZODONE HCL 100 MG PO TABS: 100.0000 mg | ORAL_TABLET | Freq: Every day | ORAL | 2 refills | Status: DC

## 2016-09-29 MED ORDER — VENLAFAXINE HCL ER 150 MG PO CP24: 300.0000 mg | ORAL_CAPSULE | Freq: Every day | ORAL | 2 refills | Status: DC
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
## 2016-10-14 ENCOUNTER — Other Ambulatory Visit (HOSPITAL_COMMUNITY): Payer: Self-pay | Admitting: Psychiatry

## 2016-10-18 ENCOUNTER — Other Ambulatory Visit (HOSPITAL_COMMUNITY): Payer: Self-pay | Admitting: Psychiatry

## 2016-10-18 ENCOUNTER — Telehealth (HOSPITAL_COMMUNITY): Payer: Self-pay | Admitting: *Deleted

## 2016-10-18 MED ORDER — GABAPENTIN 400 MG PO CAPS: ORAL_CAPSULE | ORAL | 2 refills | Status: DC

## 2016-10-18 NOTE — Telephone Encounter (Signed)
Sent on 5/17, resent today

## 2016-10-18 NOTE — Telephone Encounter (Signed)
Called pt and lm for pt to call his pharmacy about previous phone call about his medication. Pt name was on voicemail. Office number provided on voicemail if pt have any further questions

## 2016-10-18 NOTE — Telephone Encounter (Signed)
phone call from patient's father, he said pharmacy is requesting a refill on his gabapentin.

## 2016-10-19 ENCOUNTER — Other Ambulatory Visit (HOSPITAL_COMMUNITY): Payer: Self-pay | Admitting: Psychiatry

## 2016-11-29 ENCOUNTER — Encounter (HOSPITAL_COMMUNITY): Payer: Self-pay | Admitting: Psychiatry

## 2016-11-29 ENCOUNTER — Ambulatory Visit (INDEPENDENT_AMBULATORY_CARE_PROVIDER_SITE_OTHER): Payer: 59 | Admitting: Psychiatry

## 2016-11-29 VITALS — BP 131/59 | HR 74 | Ht 67.0 in | Wt 250.0 lb

## 2016-11-29 DIAGNOSIS — Z818 Family history of other mental and behavioral disorders: Secondary | ICD-10-CM | POA: Diagnosis not present

## 2016-11-29 DIAGNOSIS — Z813 Family history of other psychoactive substance abuse and dependence: Secondary | ICD-10-CM | POA: Diagnosis not present

## 2016-11-29 DIAGNOSIS — F251 Schizoaffective disorder, depressive type: Secondary | ICD-10-CM

## 2016-11-29 DIAGNOSIS — Z810 Family history of intellectual disabilities: Secondary | ICD-10-CM | POA: Diagnosis not present

## 2016-11-29 MED ORDER — GABAPENTIN 400 MG PO CAPS: ORAL_CAPSULE | ORAL | 2 refills | Status: DC

## 2016-11-29 MED ORDER — VENLAFAXINE HCL ER 150 MG PO CP24: 300.0000 mg | ORAL_CAPSULE | Freq: Every day | ORAL | 2 refills | Status: DC

## 2016-11-29 MED ORDER — OLANZAPINE 10 MG PO TABS: 15.0000 mg | ORAL_TABLET | Freq: Every day | ORAL | 2 refills | Status: DC

## 2016-11-29 MED ORDER — TRAZODONE HCL 100 MG PO TABS
100.0000 mg | ORAL_TABLET | Freq: Every day | ORAL | 2 refills | Status: DC
Start: 2016-11-29 — End: 2017-01-23
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
## 2017-01-23 ENCOUNTER — Ambulatory Visit (INDEPENDENT_AMBULATORY_CARE_PROVIDER_SITE_OTHER): Payer: 59 | Admitting: Psychiatry

## 2017-01-23 ENCOUNTER — Encounter (HOSPITAL_COMMUNITY): Payer: Self-pay | Admitting: Psychiatry

## 2017-01-23 VITALS — BP 130/70 | Ht 67.0 in | Wt 253.0 lb

## 2017-01-23 DIAGNOSIS — Z818 Family history of other mental and behavioral disorders: Secondary | ICD-10-CM

## 2017-01-23 DIAGNOSIS — F251 Schizoaffective disorder, depressive type: Secondary | ICD-10-CM

## 2017-01-23 DIAGNOSIS — R440 Auditory hallucinations: Secondary | ICD-10-CM

## 2017-01-23 DIAGNOSIS — Z813 Family history of other psychoactive substance abuse and dependence: Secondary | ICD-10-CM | POA: Diagnosis not present

## 2017-01-23 DIAGNOSIS — Z810 Family history of intellectual disabilities: Secondary | ICD-10-CM | POA: Diagnosis not present

## 2017-01-23 MED ORDER — TRAZODONE HCL 100 MG PO TABS: 100.0000 mg | ORAL_TABLET | Freq: Every day | ORAL | 2 refills | Status: DC

## 2017-01-23 MED ORDER — GABAPENTIN 400 MG PO CAPS: ORAL_CAPSULE | ORAL | 2 refills | Status: DC

## 2017-01-23 MED ORDER — VENLAFAXINE HCL ER 150 MG PO CP24: 300.0000 mg | ORAL_CAPSULE | Freq: Every day | ORAL | 2 refills | Status: DC

## 2017-01-23 MED ORDER — OLANZAPINE 20 MG PO TABS: 20.0000 mg | ORAL_TABLET | Freq: Every day | ORAL | 2 refills | Status: DC
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
## 2017-02-20 ENCOUNTER — Encounter (HOSPITAL_COMMUNITY): Payer: Self-pay | Admitting: Psychiatry

## 2017-02-20 ENCOUNTER — Ambulatory Visit (INDEPENDENT_AMBULATORY_CARE_PROVIDER_SITE_OTHER): Payer: 59 | Admitting: Psychiatry

## 2017-02-20 VITALS — BP 140/90 | Ht 67.0 in | Wt 247.0 lb

## 2017-02-20 DIAGNOSIS — F419 Anxiety disorder, unspecified: Secondary | ICD-10-CM | POA: Diagnosis not present

## 2017-02-20 DIAGNOSIS — F251 Schizoaffective disorder, depressive type: Secondary | ICD-10-CM

## 2017-02-20 DIAGNOSIS — G479 Sleep disorder, unspecified: Secondary | ICD-10-CM

## 2017-02-20 DIAGNOSIS — Z813 Family history of other psychoactive substance abuse and dependence: Secondary | ICD-10-CM

## 2017-02-20 DIAGNOSIS — F209 Schizophrenia, unspecified: Secondary | ICD-10-CM

## 2017-02-20 DIAGNOSIS — Z79899 Other long term (current) drug therapy: Secondary | ICD-10-CM | POA: Diagnosis not present

## 2017-02-20 DIAGNOSIS — F329 Major depressive disorder, single episode, unspecified: Secondary | ICD-10-CM | POA: Diagnosis not present

## 2017-02-20 DIAGNOSIS — Z818 Family history of other mental and behavioral disorders: Secondary | ICD-10-CM | POA: Diagnosis not present

## 2017-02-20 MED ORDER — TRAZODONE HCL 100 MG PO TABS: 100.0000 mg | ORAL_TABLET | Freq: Every day | ORAL | 2 refills | Status: DC

## 2017-02-20 MED ORDER — VENLAFAXINE HCL ER 150 MG PO CP24
300.0000 mg | ORAL_CAPSULE | Freq: Every day | ORAL | 2 refills | Status: DC
Start: 2017-02-20 — End: 2017-04-18

## 2017-02-20 MED ORDER — OLANZAPINE 20 MG PO TABS: 20.0000 mg | ORAL_TABLET | Freq: Every day | ORAL | 2 refills | Status: DC

## 2017-02-20 MED ORDER — GABAPENTIN 400 MG PO CAPS: ORAL_CAPSULE | ORAL | 2 refills | Status: DC
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
## 2017-04-18 ENCOUNTER — Encounter (HOSPITAL_COMMUNITY): Payer: Self-pay | Admitting: Psychiatry

## 2017-04-18 ENCOUNTER — Ambulatory Visit (INDEPENDENT_AMBULATORY_CARE_PROVIDER_SITE_OTHER): Payer: 59 | Admitting: Psychiatry

## 2017-04-18 VITALS — BP 138/82 | HR 96 | Ht 67.0 in | Wt 256.0 lb

## 2017-04-18 DIAGNOSIS — Z79899 Other long term (current) drug therapy: Secondary | ICD-10-CM

## 2017-04-18 DIAGNOSIS — Z818 Family history of other mental and behavioral disorders: Secondary | ICD-10-CM

## 2017-04-18 DIAGNOSIS — F251 Schizoaffective disorder, depressive type: Secondary | ICD-10-CM

## 2017-04-18 DIAGNOSIS — F17210 Nicotine dependence, cigarettes, uncomplicated: Secondary | ICD-10-CM | POA: Diagnosis not present

## 2017-04-18 DIAGNOSIS — Z813 Family history of other psychoactive substance abuse and dependence: Secondary | ICD-10-CM

## 2017-04-18 DIAGNOSIS — F17220 Nicotine dependence, chewing tobacco, uncomplicated: Secondary | ICD-10-CM | POA: Diagnosis not present

## 2017-04-18 MED ORDER — GABAPENTIN 400 MG PO CAPS: ORAL_CAPSULE | ORAL | 2 refills | Status: DC

## 2017-04-18 MED ORDER — OLANZAPINE 20 MG PO TABS: 20.0000 mg | ORAL_TABLET | Freq: Every day | ORAL | 2 refills | Status: DC

## 2017-04-18 MED ORDER — TRAZODONE HCL 100 MG PO TABS: 100.0000 mg | ORAL_TABLET | Freq: Every day | ORAL | 2 refills | Status: DC

## 2017-04-18 MED ORDER — VENLAFAXINE HCL ER 150 MG PO CP24
300.0000 mg | ORAL_CAPSULE | Freq: Every day | ORAL | 2 refills | Status: DC
Start: 2017-04-18 — End: 2017-06-20
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
## 2017-06-19 ENCOUNTER — Ambulatory Visit (HOSPITAL_COMMUNITY): Payer: Self-pay | Admitting: Psychiatry

## 2017-06-20 ENCOUNTER — Encounter (HOSPITAL_COMMUNITY): Payer: Self-pay | Admitting: Psychiatry

## 2017-06-20 ENCOUNTER — Ambulatory Visit (INDEPENDENT_AMBULATORY_CARE_PROVIDER_SITE_OTHER): Payer: 59 | Admitting: Psychiatry

## 2017-06-20 VITALS — BP 138/88 | HR 97 | Ht 67.0 in | Wt 255.0 lb

## 2017-06-20 DIAGNOSIS — K219 Gastro-esophageal reflux disease without esophagitis: Secondary | ICD-10-CM | POA: Diagnosis not present

## 2017-06-20 DIAGNOSIS — F251 Schizoaffective disorder, depressive type: Secondary | ICD-10-CM

## 2017-06-20 DIAGNOSIS — F17220 Nicotine dependence, chewing tobacco, uncomplicated: Secondary | ICD-10-CM | POA: Diagnosis not present

## 2017-06-20 DIAGNOSIS — Z818 Family history of other mental and behavioral disorders: Secondary | ICD-10-CM

## 2017-06-20 DIAGNOSIS — Z79899 Other long term (current) drug therapy: Secondary | ICD-10-CM | POA: Diagnosis not present

## 2017-06-20 DIAGNOSIS — Z813 Family history of other psychoactive substance abuse and dependence: Secondary | ICD-10-CM | POA: Diagnosis not present

## 2017-06-20 DIAGNOSIS — F17210 Nicotine dependence, cigarettes, uncomplicated: Secondary | ICD-10-CM

## 2017-06-20 MED ORDER — TRAZODONE HCL 100 MG PO TABS: 100.0000 mg | ORAL_TABLET | Freq: Every day | ORAL | 2 refills | Status: DC

## 2017-06-20 MED ORDER — OLANZAPINE 20 MG PO TABS: 20.0000 mg | ORAL_TABLET | Freq: Every day | ORAL | 2 refills | Status: DC

## 2017-06-20 MED ORDER — GABAPENTIN 400 MG PO CAPS: ORAL_CAPSULE | ORAL | 2 refills | Status: DC

## 2017-06-20 MED ORDER — VENLAFAXINE HCL ER 150 MG PO CP24: 300.0000 mg | ORAL_CAPSULE | Freq: Every day | ORAL | 2 refills | Status: DC
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
## 2017-08-21 ENCOUNTER — Ambulatory Visit (HOSPITAL_COMMUNITY): Payer: Self-pay | Admitting: Psychiatry

## 2017-08-30 ENCOUNTER — Encounter (HOSPITAL_COMMUNITY): Payer: Self-pay | Admitting: Psychiatry

## 2017-08-30 ENCOUNTER — Ambulatory Visit (INDEPENDENT_AMBULATORY_CARE_PROVIDER_SITE_OTHER): Payer: 59 | Admitting: Psychiatry

## 2017-08-30 VITALS — BP 130/92 | Ht 67.0 in | Wt 263.0 lb

## 2017-08-30 DIAGNOSIS — F419 Anxiety disorder, unspecified: Secondary | ICD-10-CM

## 2017-08-30 DIAGNOSIS — Q614 Renal dysplasia: Secondary | ICD-10-CM

## 2017-08-30 DIAGNOSIS — R450 Nervousness: Secondary | ICD-10-CM | POA: Diagnosis not present

## 2017-08-30 DIAGNOSIS — Z813 Family history of other psychoactive substance abuse and dependence: Secondary | ICD-10-CM | POA: Diagnosis not present

## 2017-08-30 DIAGNOSIS — F17210 Nicotine dependence, cigarettes, uncomplicated: Secondary | ICD-10-CM | POA: Diagnosis not present

## 2017-08-30 DIAGNOSIS — F251 Schizoaffective disorder, depressive type: Secondary | ICD-10-CM | POA: Diagnosis not present

## 2017-08-30 DIAGNOSIS — Z810 Family history of intellectual disabilities: Secondary | ICD-10-CM | POA: Diagnosis not present

## 2017-08-30 DIAGNOSIS — Z818 Family history of other mental and behavioral disorders: Secondary | ICD-10-CM | POA: Diagnosis not present

## 2017-08-30 MED ORDER — GABAPENTIN 400 MG PO CAPS: ORAL_CAPSULE | ORAL | 2 refills | Status: DC

## 2017-08-30 MED ORDER — OLANZAPINE 20 MG PO TABS: 20.0000 mg | ORAL_TABLET | Freq: Every day | ORAL | 2 refills | Status: DC

## 2017-08-30 MED ORDER — TRAZODONE HCL 100 MG PO TABS: 100.0000 mg | ORAL_TABLET | Freq: Every day | ORAL | 2 refills | Status: DC

## 2017-08-30 MED ORDER — VENLAFAXINE HCL ER 150 MG PO CP24: 300.0000 mg | ORAL_CAPSULE | Freq: Every day | ORAL | 2 refills | Status: DC
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
## 2017-11-02 ENCOUNTER — Ambulatory Visit (INDEPENDENT_AMBULATORY_CARE_PROVIDER_SITE_OTHER): Payer: 59 | Admitting: Psychiatry

## 2017-11-02 ENCOUNTER — Encounter (HOSPITAL_COMMUNITY): Payer: Self-pay | Admitting: Psychiatry

## 2017-11-02 VITALS — BP 143/90 | HR 69 | Ht 67.0 in | Wt 263.0 lb

## 2017-11-02 DIAGNOSIS — F251 Schizoaffective disorder, depressive type: Secondary | ICD-10-CM

## 2017-11-02 MED ORDER — OLANZAPINE 20 MG PO TABS: 20.0000 mg | ORAL_TABLET | Freq: Every day | ORAL | 2 refills | Status: DC

## 2017-11-02 MED ORDER — TRAZODONE HCL 100 MG PO TABS: 150.0000 mg | ORAL_TABLET | Freq: Every day | ORAL | 2 refills | Status: DC

## 2017-11-02 MED ORDER — VENLAFAXINE HCL ER 150 MG PO CP24: 300.0000 mg | ORAL_CAPSULE | Freq: Every day | ORAL | 2 refills | Status: DC

## 2017-11-02 MED ORDER — GABAPENTIN 400 MG PO CAPS: ORAL_CAPSULE | ORAL | 2 refills | Status: DC
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
## 2017-11-24 ENCOUNTER — Other Ambulatory Visit (HOSPITAL_COMMUNITY): Payer: Self-pay | Admitting: Psychiatry

## 2018-01-02 ENCOUNTER — Encounter (HOSPITAL_COMMUNITY): Payer: Self-pay | Admitting: Psychiatry

## 2018-01-02 ENCOUNTER — Ambulatory Visit (INDEPENDENT_AMBULATORY_CARE_PROVIDER_SITE_OTHER): Payer: 59 | Admitting: Psychiatry

## 2018-01-02 ENCOUNTER — Telehealth (HOSPITAL_COMMUNITY): Payer: Self-pay | Admitting: Psychiatry

## 2018-01-02 VITALS — BP 168/96 | HR 72 | Ht 67.0 in | Wt 252.0 lb

## 2018-01-02 DIAGNOSIS — R5383 Other fatigue: Secondary | ICD-10-CM

## 2018-01-02 DIAGNOSIS — Z813 Family history of other psychoactive substance abuse and dependence: Secondary | ICD-10-CM

## 2018-01-02 DIAGNOSIS — G4700 Insomnia, unspecified: Secondary | ICD-10-CM

## 2018-01-02 DIAGNOSIS — Z818 Family history of other mental and behavioral disorders: Secondary | ICD-10-CM | POA: Diagnosis not present

## 2018-01-02 DIAGNOSIS — R109 Unspecified abdominal pain: Secondary | ICD-10-CM

## 2018-01-02 DIAGNOSIS — Z87891 Personal history of nicotine dependence: Secondary | ICD-10-CM | POA: Diagnosis not present

## 2018-01-02 DIAGNOSIS — F251 Schizoaffective disorder, depressive type: Secondary | ICD-10-CM | POA: Diagnosis not present

## 2018-01-02 DIAGNOSIS — F17220 Nicotine dependence, chewing tobacco, uncomplicated: Secondary | ICD-10-CM

## 2018-01-02 DIAGNOSIS — R110 Nausea: Secondary | ICD-10-CM

## 2018-01-02 MED ORDER — GABAPENTIN 400 MG PO CAPS: ORAL_CAPSULE | ORAL | 2 refills | Status: DC

## 2018-01-02 MED ORDER — VENLAFAXINE HCL ER 150 MG PO CP24: 300.0000 mg | ORAL_CAPSULE | Freq: Every day | ORAL | 2 refills | Status: DC

## 2018-01-02 MED ORDER — OLANZAPINE 20 MG PO TABS: 20.0000 mg | ORAL_TABLET | Freq: Every day | ORAL | 2 refills | Status: DC

## 2018-01-02 MED ORDER — GABAPENTIN 400 MG PO CAPS: 800.0000 mg | ORAL_CAPSULE | Freq: Every day | ORAL | 2 refills | Status: DC

## 2018-01-02 MED ORDER — TRAZODONE HCL 100 MG PO TABS: 150.0000 mg | ORAL_TABLET | Freq: Every day | ORAL | 2 refills | Status: DC
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
## 2018-02-05 ENCOUNTER — Ambulatory Visit (INDEPENDENT_AMBULATORY_CARE_PROVIDER_SITE_OTHER): Payer: 59 | Admitting: Psychiatry

## 2018-02-05 ENCOUNTER — Encounter (HOSPITAL_COMMUNITY): Payer: Self-pay | Admitting: Psychiatry

## 2018-02-05 VITALS — BP 170/108 | HR 75 | Ht 67.0 in | Wt 246.0 lb

## 2018-02-05 DIAGNOSIS — Z810 Family history of intellectual disabilities: Secondary | ICD-10-CM

## 2018-02-05 DIAGNOSIS — Z87891 Personal history of nicotine dependence: Secondary | ICD-10-CM

## 2018-02-05 DIAGNOSIS — Z813 Family history of other psychoactive substance abuse and dependence: Secondary | ICD-10-CM

## 2018-02-05 DIAGNOSIS — F251 Schizoaffective disorder, depressive type: Secondary | ICD-10-CM

## 2018-02-05 DIAGNOSIS — Z818 Family history of other mental and behavioral disorders: Secondary | ICD-10-CM | POA: Diagnosis not present

## 2018-02-05 DIAGNOSIS — G4700 Insomnia, unspecified: Secondary | ICD-10-CM

## 2018-02-05 MED ORDER — GABAPENTIN 400 MG PO CAPS: 800.0000 mg | ORAL_CAPSULE | Freq: Every day | ORAL | 2 refills | Status: DC

## 2018-02-05 MED ORDER — VENLAFAXINE HCL ER 150 MG PO CP24: 300.0000 mg | ORAL_CAPSULE | Freq: Every day | ORAL | 2 refills | Status: DC

## 2018-02-05 MED ORDER — OLANZAPINE 20 MG PO TABS: 20.0000 mg | ORAL_TABLET | Freq: Every day | ORAL | 2 refills | Status: DC

## 2018-02-05 MED ORDER — HYDROXYZINE HCL 50 MG PO TABS: 50.0000 mg | ORAL_TABLET | Freq: Every day | ORAL | 0 refills | Status: DC
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
## 2018-03-04 ENCOUNTER — Other Ambulatory Visit (HOSPITAL_COMMUNITY): Payer: Self-pay | Admitting: Psychiatry

## 2018-03-11 ENCOUNTER — Other Ambulatory Visit (HOSPITAL_COMMUNITY): Payer: Self-pay | Admitting: Psychiatry

## 2018-03-18 ENCOUNTER — Other Ambulatory Visit (HOSPITAL_COMMUNITY): Payer: Self-pay | Admitting: Psychiatry

## 2018-03-27 ENCOUNTER — Other Ambulatory Visit (HOSPITAL_COMMUNITY): Payer: Self-pay | Admitting: Psychiatry

## 2018-03-27 MED ORDER — HYDROXYZINE HCL 50 MG PO TABS: ORAL_TABLET | ORAL | 2 refills | Status: DC

## 2018-04-04 ENCOUNTER — Encounter (HOSPITAL_COMMUNITY): Payer: Self-pay | Admitting: Psychiatry

## 2018-04-04 ENCOUNTER — Ambulatory Visit (INDEPENDENT_AMBULATORY_CARE_PROVIDER_SITE_OTHER): Payer: 59 | Admitting: Psychiatry

## 2018-04-04 VITALS — BP 178/100 | Ht 67.0 in | Wt 243.0 lb

## 2018-04-04 DIAGNOSIS — F251 Schizoaffective disorder, depressive type: Secondary | ICD-10-CM | POA: Diagnosis not present

## 2018-04-04 MED ORDER — VENLAFAXINE HCL ER 150 MG PO CP24: 300.0000 mg | ORAL_CAPSULE | Freq: Every day | ORAL | 2 refills | Status: DC

## 2018-04-04 MED ORDER — GABAPENTIN 400 MG PO CAPS: 800.0000 mg | ORAL_CAPSULE | Freq: Every day | ORAL | 2 refills | Status: DC

## 2018-04-04 MED ORDER — OLANZAPINE 20 MG PO TABS: 20.0000 mg | ORAL_TABLET | Freq: Every day | ORAL | 2 refills | Status: DC

## 2018-04-04 MED ORDER — HYDROXYZINE HCL 50 MG PO TABS: ORAL_TABLET | ORAL | 2 refills | Status: DC
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
## 2018-04-09 ENCOUNTER — Ambulatory Visit (HOSPITAL_COMMUNITY): Payer: 59 | Admitting: Psychiatry

## 2018-05-21 ENCOUNTER — Ambulatory Visit (INDEPENDENT_AMBULATORY_CARE_PROVIDER_SITE_OTHER): Payer: 59 | Admitting: Psychiatry

## 2018-05-21 ENCOUNTER — Encounter (HOSPITAL_COMMUNITY): Payer: Self-pay | Admitting: Psychiatry

## 2018-05-21 DIAGNOSIS — F251 Schizoaffective disorder, depressive type: Secondary | ICD-10-CM | POA: Diagnosis not present
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
## 2018-05-28 ENCOUNTER — Telehealth (HOSPITAL_COMMUNITY): Payer: Self-pay | Admitting: *Deleted

## 2018-05-28 ENCOUNTER — Ambulatory Visit (HOSPITAL_COMMUNITY): Payer: 59 | Admitting: Psychiatry

## 2018-05-28 ENCOUNTER — Other Ambulatory Visit (HOSPITAL_COMMUNITY): Payer: Self-pay | Admitting: Psychiatry

## 2018-05-28 MED ORDER — GABAPENTIN 400 MG PO CAPS: 800.0000 mg | ORAL_CAPSULE | Freq: Every day | ORAL | 2 refills | Status: DC

## 2018-05-28 MED ORDER — HYDROXYZINE HCL 50 MG PO TABS: ORAL_TABLET | ORAL | 2 refills | Status: DC

## 2018-05-28 MED ORDER — OLANZAPINE 20 MG PO TABS: 20.0000 mg | ORAL_TABLET | Freq: Every day | ORAL | 2 refills | Status: DC

## 2018-05-28 MED ORDER — VENLAFAXINE HCL ER 150 MG PO CP24: 300.0000 mg | ORAL_CAPSULE | Freq: Every day | ORAL | 2 refills | Status: DC

## 2018-05-28 NOTE — Telephone Encounter (Signed)
Dr [name] Patient rescheduled appointment with front office until 06/25/2018. Requesting  refills

## 2018-05-28 NOTE — Telephone Encounter (Signed)
sent 

## 2018-06-05 ENCOUNTER — Ambulatory Visit (HOSPITAL_COMMUNITY): Payer: 59 | Admitting: Psychiatry

## 2018-06-20 ENCOUNTER — Ambulatory Visit (INDEPENDENT_AMBULATORY_CARE_PROVIDER_SITE_OTHER): Payer: 59 | Admitting: Psychiatry

## 2018-06-20 ENCOUNTER — Encounter (HOSPITAL_COMMUNITY): Payer: Self-pay | Admitting: Psychiatry

## 2018-06-20 DIAGNOSIS — F251 Schizoaffective disorder, depressive type: Secondary | ICD-10-CM | POA: Diagnosis not present

## 2018-06-20 NOTE — Progress Notes (Signed)
   THERAPIST PROGRESS NOTE  Session Time: Wednesday 06/20/2018 3:15 PM -  4:05 PM  Participation Level: Active  Behavioral Response: CasualAlertAnxious  Type of Therapy: Individual Therapy  Treatment Goals addressed: Establish rapport, learn and implement calming skills to manage anxiety  Interventions: CBT and Supportive  Summary: Luis Jones is a 23 y.o. male who presents with a history of schizoaffective disorder. He has been a patient in this practice since 2014 and is a returning patient to this clinician. He has 3 psychiatric hospitalizations. He reports being diagnosed with kidney failure last year and dealing with a lot of health issues. He reports additional stress regarding relationship with mother as they have had a lot of arguments especially about religion in recent months. He says it is now hard for him to talk to mother about anything and states feeling as though he has lost his best friend.  He reports difficulty concentrating, fatigue, hopelessness, worthlessness, irritability, change in appetite, sleep difficulty, anxiety, and worry.   Patient reports increased stress, depressed mood, poor concentration, anxiety, and worry since last session. Per his report, he recently had an argument with his girlfriend and now worries she may not want to be with him.  He also had recent conflict with his father. Patient makes negative comments about self and expresses frustration regarding having mental illness. He denies any SI/HI. He also denies any hallucinations.  He reports being medication compliant. He reports using marijuana occassionally.    Suicidal/Homicidal: Nowithout intent/plan  Therapist Response: Established rapport, reviewed symptoms, discussed stressors, facilitated expression of thoughts and feelings, validated feelings, developed treatment plan, discussed rationale for and assisted patient practice using deep breathing, developed plan with patient to practice 5-10  minutes 2 x per day.   Plan: Return again in 2 weeks.  Diagnosis: Axis I: Schizoaffective Disorder    Axis II: No diagnosis    Adah Salvage, LCSW 06/20/2018

## 2018-06-25 ENCOUNTER — Ambulatory Visit (HOSPITAL_COMMUNITY): Payer: 59 | Admitting: Psychiatry

## 2018-07-04 ENCOUNTER — Encounter (HOSPITAL_COMMUNITY): Payer: Self-pay | Admitting: Psychiatry

## 2018-07-04 ENCOUNTER — Ambulatory Visit (INDEPENDENT_AMBULATORY_CARE_PROVIDER_SITE_OTHER): Payer: 59 | Admitting: Psychiatry

## 2018-07-04 DIAGNOSIS — F251 Schizoaffective disorder, depressive type: Secondary | ICD-10-CM | POA: Diagnosis not present

## 2018-07-04 NOTE — Progress Notes (Signed)
   THERAPIST PROGRESS NOTE  Session Time: Wednesday 07/04/2018 3:08 PM - 3:55 PM   Participation Level: Active  Behavioral Response: CasualAlertAnxious  Type of Therapy: Individual Therapy  Treatment Goal  s addressed: Establish rapport, learn and implement calming skills to manage anxiety  Interventions: CBT and Supportive  Summary: Luis Jones is a 23 y.o. male who presents with a history of schizoaffective disorder. He has been a patient in this practice since 2014 and is a returning patient to this clinician. He has 3 psychiatric hospitalizations. He reports being diagnosed with kidney failure last year and dealing with a lot of health issues. He reports additional stress regarding relationship with mother as they have had a lot of arguments especially about religion in recent months. He says it is now hard for him to talk to mother about anything and states feeling as though he has lost his best friend.  He reports difficulty concentrating, fatigue, hopelessness, worthlessness, irritability, change in appetite, sleep difficulty, anxiety, and worry.   Patient reports continued stress, depressed mood, poor concentration, anxiety, and worry since last session. He states staying to self more and engaging few activities. He mainly stays in room, plays video games, and plays on phone.  He expresses continued sadness and worry about relationship with his girlfriend. He fears she may be getting tired of the relationship and expresses hurt she recently postponed a date to visit him at his home. He reports thoughts of " I am just not good enough". He says he has begun to journal. He denies any SI/HI/and hallucinations since last session. He reports being medication compliant. He reports medication compliance.   Suicidal/Homicidal: Nowithout intent/plan  Therapist Response: reviewed symptoms, discussed stressors, facilitated expression of thoughts and feelings, validated feelings, praised and  reinforced patient's use of journaling, provided psychoeducation on connection between depression and negative thoughts, assisted patient began to examine his pattern of interaction in relationships, discussed the role of balance in his life and in relationships, assisted patient began to identify ways to increase socialization and activity with other people in support system, developed plan with patient to accept brother's invitation to go to movies this weekend,   Plan: Return again in 2 weeks.  Diagnosis: Axis I: Schizoaffective Disorder    Axis II: No diagnosis    Adah Salvage, LCSW 07/04/2018

## 2018-07-13 ENCOUNTER — Other Ambulatory Visit (HOSPITAL_COMMUNITY): Payer: Self-pay | Admitting: Psychiatry

## 2018-07-18 ENCOUNTER — Ambulatory Visit (INDEPENDENT_AMBULATORY_CARE_PROVIDER_SITE_OTHER): Payer: 59 | Admitting: Psychiatry

## 2018-07-18 DIAGNOSIS — F251 Schizoaffective disorder, depressive type: Secondary | ICD-10-CM | POA: Diagnosis not present

## 2018-07-18 NOTE — Progress Notes (Signed)
   THERAPIST PROGRESS NOTE  Session Time: Wednesday 07/18/2018 2:10 PM - 2:55 PM    Participation Level: Active  Behavioral Response: CasualAlert/less anxious  Type of Therapy: Individual Therapy  Treatment Goal  s addressed:  learn and implement calming skills to manage anxiety  Interventions: CBT and Supportive      Summary: Luis Jones is a 22 y.o. male who presents with a history of schizoaffective disorder. He has been a patient in this practice since 2014 and is a returning patient to this clinician. He has 3 psychiatric hospitalizations. He reports being diagnosed with kidney failure last year and dealing with a lot of health issues. He reports additional stress regarding relationship with mother as they have had  a lot of arguments especially about religion in recent months. He says it is now hard for him to talk to mother about anything and states feeling as though he has lost his best friend.  He reports difficulty concentrating, fatigue, hopelessness, worthlessness, irritability, change in appetite, sleep difficulty, anxiety, and worry.   Patient reports spiraling downward the end of February after girlfriend broke up with him via text message. He reports becoming distraught and cutting self with a razor. He denies having SI but says he just wanted to find a way to try to numb the pain. He reports talking with parents who initially thought about taking him to hospital but deciding against it. However, they gave patient support in helping him identify ways to cope particularly improving medication compliance per his report. Patient expresses sadness about the breakup but reports feeling better about self. He reports decreased anxiety and worry. He states focusing more on improving self. He has improved eating patterns, continued to journal, practiced deep breathing, and resumed attending church. He also has increased social involvement with his brother and friends. He wants to resume  doing art and improve his physical health/strength.  Suicidal/Homicidal: Nowithout intent/plan  Therapist Response: reviewed symptoms, discussed stressors, facilitated expression of thoughts and feelings, validated feelings, praised and reinforced patient's expressing feelings to parents, praised and reinforce patient's efforts to improve self-care and increased socialization, discussed effects on mood/thoughts/behavior, assisted patient began to identify other activities and goals for life he would like to pursue, developed plan with patient to use daily To Do list and patient agreed to develop an exercise plan for 30 days and bring to next session   Plan: Return again in 2 weeks.  Diagnosis: Axis I: Schizoaffective Disorder    Axis II: No diagnosis    Adah Salvage, LCSW 07/18/2018

## 2018-07-24 ENCOUNTER — Ambulatory Visit (HOSPITAL_COMMUNITY): Payer: 59 | Admitting: Psychiatry

## 2018-07-30 ENCOUNTER — Ambulatory Visit (INDEPENDENT_AMBULATORY_CARE_PROVIDER_SITE_OTHER): Payer: 59 | Admitting: Psychiatry

## 2018-07-30 ENCOUNTER — Other Ambulatory Visit: Payer: Self-pay

## 2018-07-30 ENCOUNTER — Encounter (HOSPITAL_COMMUNITY): Payer: Self-pay | Admitting: Psychiatry

## 2018-07-30 VITALS — BP 152/104 | HR 94 | Ht 67.75 in | Wt 228.0 lb

## 2018-07-30 DIAGNOSIS — Z79899 Other long term (current) drug therapy: Secondary | ICD-10-CM

## 2018-07-30 DIAGNOSIS — F251 Schizoaffective disorder, depressive type: Secondary | ICD-10-CM | POA: Diagnosis not present

## 2018-07-30 MED ORDER — TRAZODONE HCL 150 MG PO TABS: 150.0000 mg | ORAL_TABLET | Freq: Every day | ORAL | 2 refills | Status: DC

## 2018-07-30 MED ORDER — HYDROXYZINE HCL 50 MG PO TABS: ORAL_TABLET | ORAL | 2 refills | Status: DC

## 2018-07-30 MED ORDER — OLANZAPINE 20 MG PO TABS: 20.0000 mg | ORAL_TABLET | Freq: Every day | ORAL | 2 refills | Status: DC

## 2018-07-30 MED ORDER — VENLAFAXINE HCL ER 150 MG PO CP24: 300.0000 mg | ORAL_CAPSULE | Freq: Every day | ORAL | 2 refills | Status: DC

## 2018-07-30 MED ORDER — GABAPENTIN 400 MG PO CAPS: 800.0000 mg | ORAL_CAPSULE | Freq: Every day | ORAL | 2 refills | Status: DC
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
## 2018-08-08 ENCOUNTER — Ambulatory Visit (HOSPITAL_COMMUNITY): Payer: 59 | Admitting: Psychiatry

## 2018-08-22 ENCOUNTER — Ambulatory Visit (HOSPITAL_COMMUNITY): Payer: 59 | Admitting: Psychiatry

## 2018-08-30 ENCOUNTER — Other Ambulatory Visit: Payer: Self-pay

## 2018-08-30 ENCOUNTER — Ambulatory Visit (INDEPENDENT_AMBULATORY_CARE_PROVIDER_SITE_OTHER): Payer: 59 | Admitting: Psychiatry

## 2018-08-30 ENCOUNTER — Encounter (HOSPITAL_COMMUNITY): Payer: Self-pay | Admitting: Psychiatry

## 2018-08-30 DIAGNOSIS — F251 Schizoaffective disorder, depressive type: Secondary | ICD-10-CM | POA: Diagnosis not present

## 2018-08-30 DIAGNOSIS — F17210 Nicotine dependence, cigarettes, uncomplicated: Secondary | ICD-10-CM | POA: Diagnosis not present

## 2018-08-30 MED ORDER — VENLAFAXINE HCL ER 150 MG PO CP24: 300.0000 mg | ORAL_CAPSULE | Freq: Every day | ORAL | 2 refills | Status: DC

## 2018-08-30 MED ORDER — GABAPENTIN 400 MG PO CAPS: 800.0000 mg | ORAL_CAPSULE | Freq: Every day | ORAL | 2 refills | Status: DC

## 2018-08-30 MED ORDER — TRAZODONE HCL 150 MG PO TABS: 150.0000 mg | ORAL_TABLET | Freq: Every day | ORAL | 2 refills | Status: DC

## 2018-08-30 MED ORDER — OLANZAPINE 20 MG PO TABS: 20.0000 mg | ORAL_TABLET | Freq: Every day | ORAL | 2 refills | Status: DC

## 2018-08-30 MED ORDER — HYDROXYZINE HCL 50 MG PO TABS: ORAL_TABLET | ORAL | 2 refills | Status: DC
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
## 2018-09-14 ENCOUNTER — Telehealth (HOSPITAL_COMMUNITY): Payer: Self-pay | Admitting: Psychiatry

## 2018-09-14 ENCOUNTER — Other Ambulatory Visit: Payer: Self-pay

## 2018-09-14 ENCOUNTER — Ambulatory Visit (HOSPITAL_COMMUNITY): Payer: 59 | Admitting: Psychiatry

## 2018-09-14 NOTE — Telephone Encounter (Signed)
Therapist attempted to contact patient via text for scheduled appointment ... no response 

## 2018-09-25 ENCOUNTER — Ambulatory Visit (INDEPENDENT_AMBULATORY_CARE_PROVIDER_SITE_OTHER): Payer: 59 | Admitting: Psychiatry

## 2018-09-25 ENCOUNTER — Other Ambulatory Visit: Payer: Self-pay

## 2018-09-25 DIAGNOSIS — F251 Schizoaffective disorder, depressive type: Secondary | ICD-10-CM

## 2018-09-25 NOTE — Progress Notes (Signed)
Virtual Visit via Video Note  I connected with [name] on 09/25/18 at  3:00 PM EDT by a video enabled telemedicine application and verified that I am speaking with the correct person using two identifiers.   I discussed the limitations of evaluation and management by telemedicine and the availability of in person appointments. The patient expressed understanding and agreed to proceed.   I provided  35 minutes of non-face-to-face time during this encounter.   Adah Salvage, LCSW   THERAPIST PROGRESS NOTE  Session Time: Tuesday 09/25/2018 3:05 - 3:40 PM   Participation Level: Active  Behavioral Response: CasualAlert/less anxious  Type of Therapy: Individual Therapy  Treatment Goal  s addressed:  learn and implement calming skills to manage anxiety  Interventions: CBT and Supportive      Summary: Luis Jones is a 23 y.o. male who presents with a history of schizoaffective disorder. He has been a patient in this practice since 2014 and is a returning patient to this clinician. He has 3 psychiatric hospitalizations. He reports being diagnosed with kidney failure last year and dealing with a lot of health issues. He reports additional stress regarding relationship with mother as they have had  a lot of arguments especially about religion in recent months. He says it is now hard for him to talk to mother about anything and states feeling as though he has lost his best friend.  He reports difficulty concentrating, fatigue, hopelessness, worthlessness, irritability, change in appetite, sleep difficulty, anxiety, and worry.   Patient last was seen about 2 months ago. He reports having an anger outburst and escalating arguments with family about a month ago. He attributes this to going several days without sleep. Since that time, he and his family have talked and resolved issues. He reports having surgery in April to have ureteral stent replaced. He reports feeling much better and states  not being depressed. He reports feeling better about self and accepting he is different.  He reports he doesn't think much about the breakup with girlfriend. He has ended some other negative relationships and this has been helpful per his report. He has been much more involved in activities and has taken an interest in cooking. He also has been playing his guitar and listening to music. He has plans of resuming doing art. He reports improved eating patterns and improved self-care regarding exercising.  He states wanting to eventually get some type of job preferably in landscaping.    Suicidal/Homicidal: Nowithout intent/plan  Therapist Response: reviewed symptoms, discussed stressors, facilitated expression of thoughts and feelings, validated feelings, praised and reinforced patient's improved self-care and increased involvement in activities, discussed with patient ways to maintain balance and consistent efforts  Plan: Return again in 2 weeks.  Diagnosis: Axis I: Schizoaffective Disorder    Axis II: No diagnosis    Adah Salvage, LCSW 09/25/2018

## 2018-09-27 ENCOUNTER — Ambulatory Visit (INDEPENDENT_AMBULATORY_CARE_PROVIDER_SITE_OTHER): Payer: 59 | Admitting: Psychiatry

## 2018-09-27 ENCOUNTER — Encounter (HOSPITAL_COMMUNITY): Payer: Self-pay | Admitting: Psychiatry

## 2018-09-27 ENCOUNTER — Other Ambulatory Visit: Payer: Self-pay

## 2018-09-27 DIAGNOSIS — F251 Schizoaffective disorder, depressive type: Secondary | ICD-10-CM

## 2018-09-27 MED ORDER — OLANZAPINE 20 MG PO TABS: 20.0000 mg | ORAL_TABLET | Freq: Every day | ORAL | 2 refills | Status: DC

## 2018-09-27 MED ORDER — VENLAFAXINE HCL ER 150 MG PO CP24: 300.0000 mg | ORAL_CAPSULE | Freq: Every day | ORAL | 2 refills | Status: DC

## 2018-09-27 MED ORDER — GABAPENTIN 400 MG PO CAPS: 800.0000 mg | ORAL_CAPSULE | Freq: Every day | ORAL | 2 refills | Status: DC

## 2018-09-27 MED ORDER — HYDROXYZINE HCL 50 MG PO TABS: 50.0000 mg | ORAL_TABLET | Freq: Every day | ORAL | 2 refills | Status: DC

## 2018-09-27 MED ORDER — TRAZODONE HCL 150 MG PO TABS: 150.0000 mg | ORAL_TABLET | Freq: Every day | ORAL | 2 refills | Status: DC
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
## 2018-11-29 ENCOUNTER — Other Ambulatory Visit: Payer: Self-pay

## 2018-11-29 ENCOUNTER — Ambulatory Visit (INDEPENDENT_AMBULATORY_CARE_PROVIDER_SITE_OTHER): Payer: 59 | Admitting: Psychiatry

## 2018-11-29 ENCOUNTER — Encounter (HOSPITAL_COMMUNITY): Payer: Self-pay | Admitting: Psychiatry

## 2018-11-29 DIAGNOSIS — F251 Schizoaffective disorder, depressive type: Secondary | ICD-10-CM | POA: Diagnosis not present

## 2018-11-29 MED ORDER — VENLAFAXINE HCL ER 150 MG PO CP24: 300.0000 mg | ORAL_CAPSULE | Freq: Every day | ORAL | 2 refills | Status: DC

## 2018-11-29 MED ORDER — TRAZODONE HCL 150 MG PO TABS: 150.0000 mg | ORAL_TABLET | Freq: Every day | ORAL | 2 refills | Status: DC

## 2018-11-29 MED ORDER — GABAPENTIN 400 MG PO CAPS: 800.0000 mg | ORAL_CAPSULE | Freq: Every day | ORAL | 2 refills | Status: DC

## 2018-11-29 MED ORDER — HYDROXYZINE HCL 50 MG PO TABS: 50.0000 mg | ORAL_TABLET | Freq: Every day | ORAL | 2 refills | Status: DC

## 2018-11-29 MED ORDER — OLANZAPINE 20 MG PO TABS: 20.0000 mg | ORAL_TABLET | Freq: Every day | ORAL | 2 refills | Status: DC
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
## 2019-02-12 ENCOUNTER — Ambulatory Visit (INDEPENDENT_AMBULATORY_CARE_PROVIDER_SITE_OTHER): Payer: 59 | Admitting: Psychiatry

## 2019-02-12 ENCOUNTER — Other Ambulatory Visit: Payer: Self-pay

## 2019-02-12 DIAGNOSIS — F251 Schizoaffective disorder, depressive type: Secondary | ICD-10-CM | POA: Diagnosis not present

## 2019-02-12 NOTE — Progress Notes (Signed)
Virtual Visit via Telephone Note  I connected with Luis Jones on 02/12/19 at 1:15 PM EDT by telephone and verified that I am speaking with the correct person using two identifiers.   I discussed the limitations, risks, security and privacy concerns of performing an evaluation and management service by telephone and the availability of in person appointments. I also discussed with the patient that there may be a patient responsible charge related to this service. The patient expressed understanding and agreed to proceed.   I provided 25 minutes of non-face-to-face time during this encounter.   Luis Smoker, LCSW   THERAPIST PROGRESS NOTE  Session Time: Tuesday 02/12/2019 1:15 PM - 1:40 PM   Participation Level: Active  Behavioral Response: CasualAlert/euthymic  Type of Therapy: Individual Therapy  Treatment Goal  s addressed:  learn and implement calming skills to manage anxiety  Interventions: CBT and Supportive      Summary: Luis Jones is a 23 y.o. male who presents with a history of schizoaffective disorder. He has been a patient in this practice since 2014 and is a returning patient to this clinician. He has 3 psychiatric hospitalizations. He reports being diagnosed with kidney failure last year and dealing with a lot of health issues. He reports additional stress regarding relationship with mother as they have had  a lot of arguments especially about religion in recent months. He says it is now hard for him to talk to mother about anything and states feeling as though he has lost his best friend.  He reports difficulty concentrating, fatigue, hopelessness, worthlessness, irritability, change in appetite, sleep difficulty, anxiety, and worry.   Patient last was seen about 4 months ago. He reports doing very well since last session and states calling just to check in. He reports improved relationship with father and being very happy about this. He also reports getting along  well with rest of his family and is looking forward to a family trip next month. He continues to feel better about self and states deciding to focus on self. He says he isn't dating anyone as he wants to avoid drama, He says he feels more peace with the way things are now. He reports having down days occasionally but using music and watching TV to cope. He reports occasional anxiety but reports coping well. He reports continued positive self-care and says he exercises. He reports some weight loss due to kidney issues.He continues to have some sleep difficulty.   Suicidal/Homicidal: Nowithout intent/plan  Therapist Response: discussed patient's progress, praised and reinforced continued behavioral activation/improved self-care/focus, discussed effects, discussed ways to improve sleep hygiene, discussed terminating services as patient reports doing very well and coping well, agreed to terminate and encouraged patient to call practice should he need psychotherapy services in the future, also discussed patient will continue to see psychiatrist Dr. Harrington Challenger for medication management,   Plan: Return again in 2 weeks.  Diagnosis: Axis I: Schizoaffective Disorder    Axis II: No diagnosis    Luis Smoker, LCSW 02/12/2019   Outpatient Therapist Discharge Summary  Luis Jones    Nov 30, 1995   Admission Date:  11/03/2011 Discharge Date:  02/12/2019 Reason for Discharge: Treatment completed Diagnosis:  Axis I:  Schizoaffective disorder, depressive type [facility])   Comments:  Patient is encouraged to call this practice should he need psychotherapy services in the future. He will continue to see psychiatrist Dr. Harrington Challenger for medication management.  Luis E Bynum LCSW

## 2019-03-07 ENCOUNTER — Ambulatory Visit (INDEPENDENT_AMBULATORY_CARE_PROVIDER_SITE_OTHER): Payer: 59 | Admitting: Psychiatry

## 2019-03-07 ENCOUNTER — Encounter (HOSPITAL_COMMUNITY): Payer: Self-pay | Admitting: Psychiatry

## 2019-03-07 ENCOUNTER — Other Ambulatory Visit: Payer: Self-pay

## 2019-03-07 DIAGNOSIS — F251 Schizoaffective disorder, depressive type: Secondary | ICD-10-CM | POA: Diagnosis not present

## 2019-03-07 MED ORDER — OLANZAPINE 20 MG PO TABS: 20.0000 mg | ORAL_TABLET | Freq: Every day | ORAL | 2 refills | Status: DC

## 2019-03-07 MED ORDER — GABAPENTIN 400 MG PO CAPS: 800.0000 mg | ORAL_CAPSULE | Freq: Every day | ORAL | 2 refills | Status: DC

## 2019-03-07 MED ORDER — TRAZODONE HCL 150 MG PO TABS: 150.0000 mg | ORAL_TABLET | Freq: Every day | ORAL | 2 refills | Status: DC

## 2019-03-07 MED ORDER — VENLAFAXINE HCL ER 150 MG PO CP24: 300.0000 mg | ORAL_CAPSULE | Freq: Every day | ORAL | 2 refills | Status: DC

## 2019-03-07 MED ORDER — HYDROXYZINE HCL 50 MG PO TABS: 50.0000 mg | ORAL_TABLET | Freq: Every day | ORAL | 2 refills | Status: DC
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
## 2019-05-29 ENCOUNTER — Telehealth (HOSPITAL_COMMUNITY): Payer: Self-pay | Admitting: *Deleted

## 2019-05-29 ENCOUNTER — Other Ambulatory Visit (HOSPITAL_COMMUNITY): Payer: Self-pay | Admitting: Psychiatry

## 2019-05-29 MED ORDER — GABAPENTIN 400 MG PO CAPS: 800.0000 mg | ORAL_CAPSULE | Freq: Every day | ORAL | 2 refills | Status: DC

## 2019-05-29 NOTE — Telephone Encounter (Signed)
sent 

## 2019-05-29 NOTE — Telephone Encounter (Signed)
PATIENT'S DAD CALLED REQUESTED REFILL gabapentin (NEURONTIN) 400 MG capsule. DAD INFORMED OF UPCOMING APPT ON 06/10/2019 @ 2PM

## 2019-06-07 ENCOUNTER — Ambulatory Visit (INDEPENDENT_AMBULATORY_CARE_PROVIDER_SITE_OTHER): Payer: 59 | Admitting: Psychiatry

## 2019-06-07 ENCOUNTER — Other Ambulatory Visit: Payer: Self-pay

## 2019-06-07 DIAGNOSIS — F251 Schizoaffective disorder, depressive type: Secondary | ICD-10-CM | POA: Diagnosis not present

## 2019-06-07 NOTE — Progress Notes (Signed)
Virtual Visit via Video Note  I connected with Luis Jones on 06/07/19 at 10:15 AM ESTby a video enabled telemedicine application and verified that I am speaking with the correct person using two identifiers.   I discussed the limitations of evaluation and management by telemedicine and the availability of in person appointments. The patient expressed understanding and agreed to proceed.  I provided 20 minutes of non-face-to-face time during this encounter.   Adah Salvage, LCSW   THERAPIST PROGRESS NOTE  Session Time: Friday 06/07/2019 10:15 AM - 10:35 AM   Participation Level: Active  Behavioral Response: CasualAlert/euthymic  Type of Therapy: Individual Therapy  Treatment Goal  s addressed:  learn and implement calming skills to manage anxiety  Interventions: CBT and Supportive      Summary: Luis Jones is a 24 y.o. male who presents with a history of schizoaffective disorder. He has been a patient in this practice since 2014 and is a returning patient to this clinician. He has 3 psychiatric hospitalizations. He reports being diagnosed with kidney failure last year and dealing with a lot of health issues. He reports additional stress regarding relationship with mother as they have had  a lot of arguments especially about religion in recent months. He says it is now hard for him to talk to mother about anything and states feeling as though he has lost his best friend.  He reports difficulty concentrating, fatigue, hopelessness, worthlessness, irritability, change in appetite, sleep difficulty, anxiety, and worry.   Patient last was seen in September 2020. He reports making contact to discuss his progress.  He reports he is doing very well.  He reports continued positive relationship with his parents and his brother.  He also reports social contact with 2 friends and reports he is keeping his social circle small.  He has improved efforts regarding self-care and reports positive  eating patterns, positive sleep patterns, and continued exercise.  He is pleased with his weight loss.  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  He reports having anxiety at times when he leaves his home but managing it well using deep breathing and coping statements.  He denies any command hallucinations and says he cannot remember the last time he had hallucinations.  He denies any illicit drug or alcohol use.    Suicidal/Homicidal: Nowithout intent/plan  Therapist Response: discussed patient's progress, praised and reinforced continued behavioral activation/improved self-care/focus, reviewed coping and relaxation techniques to cope with anxiety,  also discussed patient will continue to see psychiatrist Dr. [name] for medication management,   Plan: Patient will call should he need another appointment  Diagnosis: Axis I: Schizoaffective Disorder    Axis II: No diagnosis    Adah Salvage, LCSW 06/07/2019

## 2019-06-10 ENCOUNTER — Encounter (HOSPITAL_COMMUNITY): Payer: Self-pay | Admitting: Psychiatry

## 2019-06-10 ENCOUNTER — Ambulatory Visit (INDEPENDENT_AMBULATORY_CARE_PROVIDER_SITE_OTHER): Payer: 59 | Admitting: Psychiatry

## 2019-06-10 ENCOUNTER — Other Ambulatory Visit: Payer: Self-pay

## 2019-06-10 DIAGNOSIS — F251 Schizoaffective disorder, depressive type: Secondary | ICD-10-CM | POA: Diagnosis not present

## 2019-06-10 MED ORDER — OLANZAPINE 20 MG PO TABS: 20.0000 mg | ORAL_TABLET | Freq: Every day | ORAL | 2 refills | Status: DC

## 2019-06-10 MED ORDER — HYDROXYZINE HCL 50 MG PO TABS: 50.0000 mg | ORAL_TABLET | Freq: Every day | ORAL | 2 refills | Status: DC

## 2019-06-10 MED ORDER — TRAZODONE HCL 150 MG PO TABS: 150.0000 mg | ORAL_TABLET | Freq: Every day | ORAL | 2 refills | Status: DC

## 2019-06-10 MED ORDER — GABAPENTIN 400 MG PO CAPS: 800.0000 mg | ORAL_CAPSULE | Freq: Every day | ORAL | 2 refills | Status: DC

## 2019-06-10 MED ORDER — VENLAFAXINE HCL ER 150 MG PO CP24: 300.0000 mg | ORAL_CAPSULE | Freq: Every day | ORAL | 2 refills | Status: DC
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
## 2019-06-17 ENCOUNTER — Ambulatory Visit (INDEPENDENT_AMBULATORY_CARE_PROVIDER_SITE_OTHER): Payer: 59 | Admitting: Psychiatry

## 2019-06-17 ENCOUNTER — Other Ambulatory Visit: Payer: Self-pay

## 2019-06-17 ENCOUNTER — Encounter (HOSPITAL_COMMUNITY): Payer: Self-pay | Admitting: Psychiatry

## 2019-06-17 DIAGNOSIS — F251 Schizoaffective disorder, depressive type: Secondary | ICD-10-CM
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
## 2019-06-18 ENCOUNTER — Other Ambulatory Visit: Payer: Self-pay

## 2019-06-18 ENCOUNTER — Telehealth (HOSPITAL_COMMUNITY): Payer: Self-pay | Admitting: Psychiatry

## 2019-06-18 ENCOUNTER — Ambulatory Visit (HOSPITAL_COMMUNITY): Payer: 59 | Admitting: Psychiatry

## 2019-06-18 NOTE — Telephone Encounter (Signed)
Therapist attempted to contact patient twice via text through [email or web address] platform, no response.  Therapist then called mobile number on file and patient's mother answered the phone.  She indicated patient was at home sleeping as he has been experiencing elevated blood pressure and significant anxiety.  Therapist informed  mother that office would call patient to reschedule his appointment.

## 2019-06-24 ENCOUNTER — Telehealth (HOSPITAL_COMMUNITY): Payer: Self-pay | Admitting: Psychiatry

## 2019-06-24 NOTE — Telephone Encounter (Signed)
Therapist returned call to Luis Jones, Child psychotherapist for [employer].  Ms. Luis Jones requested information about patient's mental stability as part of a comprehensive psychosocial assessment for a kidney transplant.  Therapist informed Ms. Luis Jones patient appeared to be stable during his last appointment with therapist. However, that appointment occurred prior to patient being informed about his deteriorating kidney function and possibility of kidney transplant.  Therapist also informed Ms. Luis Jones clinician's information is limited as patient has been seen sporadically in the past year. Therapist informed Ms. Luis Jones patient missed his last appointment and does not have another scheduled at this point. Ms. Luis Jones indicated she will talk with patient at his next appointment with her and will recommend regular appointments with this clinician and his psychiatrist as part condition for consideration of transplant.

## 2019-08-20 ENCOUNTER — Other Ambulatory Visit (HOSPITAL_COMMUNITY): Payer: Self-pay | Admitting: Psychiatry

## 2019-09-10 ENCOUNTER — Encounter (HOSPITAL_COMMUNITY): Payer: Self-pay | Admitting: Psychiatry

## 2019-09-10 ENCOUNTER — Telehealth (INDEPENDENT_AMBULATORY_CARE_PROVIDER_SITE_OTHER): Payer: 59 | Admitting: Psychiatry

## 2019-09-10 ENCOUNTER — Other Ambulatory Visit: Payer: Self-pay

## 2019-09-10 DIAGNOSIS — F251 Schizoaffective disorder, depressive type: Secondary | ICD-10-CM | POA: Diagnosis not present

## 2019-09-10 MED ORDER — HYDROXYZINE HCL 50 MG PO TABS: 50.0000 mg | ORAL_TABLET | Freq: Every day | ORAL | 2 refills | Status: DC

## 2019-09-10 MED ORDER — VENLAFAXINE HCL ER 150 MG PO CP24: 300.0000 mg | ORAL_CAPSULE | Freq: Every day | ORAL | 2 refills | Status: DC

## 2019-09-10 MED ORDER — TRAZODONE HCL 150 MG PO TABS: 150.0000 mg | ORAL_TABLET | Freq: Every day | ORAL | 2 refills | Status: DC

## 2019-09-10 MED ORDER — OLANZAPINE 20 MG PO TABS: ORAL_TABLET | ORAL | 1 refills | Status: DC

## 2019-09-10 MED ORDER — GABAPENTIN 400 MG PO CAPS: 800.0000 mg | ORAL_CAPSULE | Freq: Every day | ORAL | 2 refills | Status: DC
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
## 2019-12-05 ENCOUNTER — Telehealth (INDEPENDENT_AMBULATORY_CARE_PROVIDER_SITE_OTHER): Payer: 59 | Admitting: Psychiatry

## 2019-12-05 ENCOUNTER — Encounter (HOSPITAL_COMMUNITY): Payer: Self-pay | Admitting: Psychiatry

## 2019-12-05 ENCOUNTER — Other Ambulatory Visit (HOSPITAL_COMMUNITY): Payer: Self-pay | Admitting: Psychiatry

## 2019-12-05 ENCOUNTER — Telehealth (HOSPITAL_COMMUNITY): Payer: Self-pay | Admitting: *Deleted

## 2019-12-05 ENCOUNTER — Other Ambulatory Visit: Payer: Self-pay

## 2019-12-05 DIAGNOSIS — F251 Schizoaffective disorder, depressive type: Secondary | ICD-10-CM

## 2019-12-05 MED ORDER — TRAZODONE HCL 150 MG PO TABS: 150.0000 mg | ORAL_TABLET | Freq: Every day | ORAL | 2 refills | Status: DC

## 2019-12-05 MED ORDER — HYDROXYZINE HCL 50 MG PO TABS: 50.0000 mg | ORAL_TABLET | Freq: Every day | ORAL | 2 refills | Status: DC

## 2019-12-05 MED ORDER — GABAPENTIN 300 MG PO CAPS
300.0000 mg | ORAL_CAPSULE | Freq: Every day | ORAL | 2 refills | Status: DC
Start: 2019-12-05 — End: 2020-01-15

## 2019-12-05 MED ORDER — GABAPENTIN 400 MG PO CAPS: 800.0000 mg | ORAL_CAPSULE | Freq: Every day | ORAL | 2 refills | Status: DC

## 2019-12-05 MED ORDER — OLANZAPINE 20 MG PO TABS: ORAL_TABLET | ORAL | 1 refills | Status: DC

## 2019-12-05 MED ORDER — VENLAFAXINE HCL ER 150 MG PO CP24: 300.0000 mg | ORAL_CAPSULE | Freq: Every day | ORAL | 2 refills | Status: DC
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
## 2019-12-05 NOTE — Telephone Encounter (Signed)
Tell dad I decreased it to 300 mg

## 2019-12-05 NOTE — Telephone Encounter (Signed)
Patient father called stating that patient dialysis provider stated that patient Gabapentin 800 mg QHS is way too high and the most patient can be on is about 300 mg. Per patient father Daxx Tiggs the reaction to his treatment. Practice [facility] [phone]. Patient father number is [phone].

## 2019-12-06 NOTE — Telephone Encounter (Signed)
Informed patient dad and he verbalized understanding.

## 2019-12-10 ENCOUNTER — Ambulatory Visit (INDEPENDENT_AMBULATORY_CARE_PROVIDER_SITE_OTHER): Payer: 59 | Admitting: Psychiatry

## 2019-12-10 ENCOUNTER — Other Ambulatory Visit: Payer: Self-pay

## 2019-12-10 DIAGNOSIS — F251 Schizoaffective disorder, depressive type: Secondary | ICD-10-CM
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
## 2020-01-15 ENCOUNTER — Telehealth (INDEPENDENT_AMBULATORY_CARE_PROVIDER_SITE_OTHER): Payer: 59 | Admitting: Psychiatry

## 2020-01-15 ENCOUNTER — Other Ambulatory Visit: Payer: Self-pay

## 2020-01-15 ENCOUNTER — Encounter (HOSPITAL_COMMUNITY): Payer: Self-pay | Admitting: Psychiatry

## 2020-01-15 ENCOUNTER — Ambulatory Visit (INDEPENDENT_AMBULATORY_CARE_PROVIDER_SITE_OTHER): Payer: 59 | Admitting: Psychiatry

## 2020-01-15 DIAGNOSIS — F251 Schizoaffective disorder, depressive type: Secondary | ICD-10-CM

## 2020-01-15 MED ORDER — VENLAFAXINE HCL ER 150 MG PO CP24: 300.0000 mg | ORAL_CAPSULE | Freq: Every day | ORAL | 2 refills | Status: DC

## 2020-01-15 MED ORDER — TRAZODONE HCL 150 MG PO TABS: 150.0000 mg | ORAL_TABLET | Freq: Every day | ORAL | 2 refills | Status: DC

## 2020-01-15 MED ORDER — GABAPENTIN 300 MG PO CAPS: 300.0000 mg | ORAL_CAPSULE | Freq: Every day | ORAL | 2 refills | Status: DC

## 2020-01-15 MED ORDER — OLANZAPINE 20 MG PO TABS: ORAL_TABLET | ORAL | 1 refills | Status: DC

## 2020-01-15 MED ORDER — HYDROXYZINE HCL 50 MG PO TABS: 50.0000 mg | ORAL_TABLET | Freq: Every day | ORAL | 2 refills | Status: DC

## 2020-01-15 NOTE — Progress Notes (Signed)
Virtual Visit via Video Note  I connected with Luis Jones on 01/15/20 at 11:08 AM EDT  by a video enabled telemedicine application and verified that I am speaking with the correct person using two identifiers.   I discussed the limitations of evaluation and management by telemedicine and the availability of in person appointments. The patient expressed understanding and agreed to proceed.  I provided 33 minutes of non-face-to-face time during this encounter.   Adah Salvage, LCSW    THERAPIST PROGRESS NOTE  Location:  Patient - home/ Provider - [facility] Outpatient [place] office   Session Time: Wednesday 01/15/2020 11:08 AM - 11:41 AM   Participation Level: Active  Behavioral Response: CasualAlert/euthymic  Type of Therapy: Individual Therapy  Treatment Goal  s addressed:  learn and implement calming skills to manage anxiety  Interventions: CBT and Supportive      Summary: Luis Jones is a 24 y.o. male who presents with a history of schizoaffective disorder. He has been a patient in this practice since 2014 and is a returning patient to this clinician. He has 3 psychiatric hospitalizations.  He has a history of kidney failure and recently started dialysis.  He reports being very stressed and states being worried and nervous about the whole process.  He also reports feeling very depressed and isolating self.     Patient last was seen via virtual visit about 4 weeks ago.  Patient reports much improved mood since last session and reports minimal to no symptoms of anxiety and depression.  He has been doing dialysis at home for the past 3 weeks.  He expresses confidence in using the machine at home.  He continues to go in for labs about once a month and has easy access to medical staff through visits and phone calls.  He reports having a positive daily routine and structure with balance.  He reports positive self-care regarding eating, sleeping, and exercise.  He also enjoys hobbies such  as plan is guitar, journaling, and playing video games.  He reports social involvement with his parents and his brother.  He also reports going out shopping occasionally.  He reports successfully using deep breathing, meditation, and his spirituality as well as replacing negative thoughts with more helpful thoughts when he does experience anxiety.  Patient is pleased with his progress and expresses confidence in his ability to manage stress and anxiety.  Suicidal/Homicidal: Nowithout intent/plan  Therapist Response: Reviewed symptoms, discussed patient's adjustment to home dialysis, facilitated expression of thoughts and feelings, validated feelings, praised and reinforced patient's initiative and determination, praised and reinforced patient's continued behavioral activation/improved self-care/focus, praised and reinforced patient's use of relaxation and coping techniques, discussed patient's feelings about termination, will send patient mental health maintenance plan in preparation for next session  Plan: Patient will call should he need another appointment  Diagnosis: Axis I: Schizoaffective Disorder    Axis II: No diagnosis    Adah Salvage, LCSW 01/15/2020
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
## 2020-03-05 ENCOUNTER — Telehealth (HOSPITAL_COMMUNITY): Payer: Self-pay | Admitting: Psychiatry

## 2020-03-05 NOTE — Telephone Encounter (Signed)
Called to schedule f/u appt, left voice message

## 2020-03-16 ENCOUNTER — Encounter (HOSPITAL_COMMUNITY): Payer: Self-pay | Admitting: Psychiatry

## 2020-03-16 ENCOUNTER — Other Ambulatory Visit: Payer: Self-pay

## 2020-03-16 ENCOUNTER — Telehealth (INDEPENDENT_AMBULATORY_CARE_PROVIDER_SITE_OTHER): Payer: 59 | Admitting: Psychiatry

## 2020-03-16 DIAGNOSIS — F251 Schizoaffective disorder, depressive type: Secondary | ICD-10-CM | POA: Diagnosis not present

## 2020-03-16 MED ORDER — HYDROXYZINE HCL 50 MG PO TABS: 50.0000 mg | ORAL_TABLET | Freq: Every day | ORAL | 2 refills | Status: DC

## 2020-03-16 MED ORDER — GABAPENTIN 300 MG PO CAPS: 300.0000 mg | ORAL_CAPSULE | Freq: Every day | ORAL | 2 refills | Status: DC

## 2020-03-16 MED ORDER — VENLAFAXINE HCL ER 150 MG PO CP24: 300.0000 mg | ORAL_CAPSULE | Freq: Every day | ORAL | 2 refills | Status: DC

## 2020-03-16 MED ORDER — OLANZAPINE 20 MG PO TABS
ORAL_TABLET | ORAL | 1 refills | Status: DC
Start: 2020-03-16 — End: 2020-04-28

## 2020-03-16 MED ORDER — TRAZODONE HCL 150 MG PO TABS
150.0000 mg | ORAL_TABLET | Freq: Every day | ORAL | 2 refills | Status: DC
Start: 2020-03-16 — End: 2020-04-28
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
## 2020-03-24 ENCOUNTER — Telehealth (HOSPITAL_COMMUNITY): Payer: Self-pay | Admitting: Psychiatry

## 2020-03-24 NOTE — Telephone Encounter (Signed)
Called pt to schedule f/u appt, left vm

## 2020-04-28 ENCOUNTER — Encounter (HOSPITAL_COMMUNITY): Payer: Self-pay | Admitting: Psychiatry

## 2020-04-28 ENCOUNTER — Telehealth (INDEPENDENT_AMBULATORY_CARE_PROVIDER_SITE_OTHER): Payer: 59 | Admitting: Psychiatry

## 2020-04-28 ENCOUNTER — Other Ambulatory Visit: Payer: Self-pay

## 2020-04-28 DIAGNOSIS — F251 Schizoaffective disorder, depressive type: Secondary | ICD-10-CM

## 2020-04-28 MED ORDER — VENLAFAXINE HCL ER 150 MG PO CP24: 300.0000 mg | ORAL_CAPSULE | Freq: Every day | ORAL | 2 refills | Status: DC

## 2020-04-28 MED ORDER — OLANZAPINE 20 MG PO TABS
ORAL_TABLET | ORAL | 1 refills | Status: DC
Start: 2020-04-28 — End: 2020-11-03

## 2020-04-28 MED ORDER — HYDROXYZINE HCL 50 MG PO TABS
50.0000 mg | ORAL_TABLET | Freq: Every day | ORAL | 2 refills | Status: DC
Start: 2020-04-28 — End: 2020-11-03

## 2020-04-28 MED ORDER — GABAPENTIN 300 MG PO CAPS: 300.0000 mg | ORAL_CAPSULE | Freq: Every day | ORAL | 2 refills | Status: DC

## 2020-04-28 MED ORDER — TRAZODONE HCL 150 MG PO TABS
150.0000 mg | ORAL_TABLET | Freq: Every day | ORAL | 2 refills | Status: DC
Start: 2020-04-28 — End: 2020-11-03
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
## 2020-05-27 ENCOUNTER — Telehealth (HOSPITAL_COMMUNITY): Payer: Self-pay | Admitting: Psychiatry

## 2020-05-27 ENCOUNTER — Ambulatory Visit (HOSPITAL_COMMUNITY): Payer: 59 | Admitting: Psychiatry

## 2020-05-27 ENCOUNTER — Other Ambulatory Visit: Payer: Self-pay

## 2020-05-27 NOTE — Telephone Encounter (Signed)
Called to reschedule appt, unable to leave vm, mailbox full

## 2020-06-04 ENCOUNTER — Other Ambulatory Visit: Payer: Self-pay

## 2020-06-04 ENCOUNTER — Ambulatory Visit (INDEPENDENT_AMBULATORY_CARE_PROVIDER_SITE_OTHER): Payer: 59 | Admitting: Psychiatry

## 2020-06-04 ENCOUNTER — Telehealth (HOSPITAL_COMMUNITY): Payer: Self-pay | Admitting: Psychiatry

## 2020-06-04 DIAGNOSIS — F251 Schizoaffective disorder, depressive type: Secondary | ICD-10-CM

## 2020-06-04 NOTE — Progress Notes (Signed)
Virtual Visit via Telephone Note  I connected with [name] on 06/04/20 at 11:35 AM EST by telephone and verified that I am speaking with the correct person using two identifiers.  Location: Patient: Home Provider: [facility] Outpatient [place] office    I discussed the limitations, risks, security and privacy concerns of performing an evaluation and management service by telephone and the availability of in person appointments. I also discussed with the patient that there may be a patient responsible charge related to this service. The patient expressed understanding and agreed to proceed.  I provided 21 minutes of non-face-to-face time during this encounter.   Adah Salvage, LCSW  THERAPIST PROGRESS NOTE  Location:  Patient - home/ Provider - [facility] Outpatient [place] office   Session Time: Thursday 06/04/2020 11:35 - 11:56 AM   Participation Level: Active  Behavioral Response: CasualAlert/euthymic  Type of Therapy: Individual Therapy  Treatment Goal  s addressed:  learn and implement calming skills to manage anxiety  Interventions: CBT and Supportive      Summary: Luis Jones is a 25 y.o. male who presents with a history of schizoaffective disorder. He has been a patient in this practice since 2014 and is a returning patient to this clinician. He has 3 psychiatric hospitalizations.  He has a history of kidney failure and recently started dialysis.  He reports being very stressed and states being worried and nervous about the whole process.  He also reports feeling very depressed and isolating self.     Patient last was seen via virtual visit about 4 months ago.  Patient reports scheduling an appointment to be seen as he experienced an intense panic attack in December 2021.  He reports he was out at [employer] with his family and was very tired due to sleep difficulty the night before.  He is pleased he did not "flip out" like he may have done in the past.  Per his report, he  used self talk and tried to focus on the people around him to manage.  He reports he was able to calm himself down.  Patient denies having any significant anxiety since that time.  He reports continuing to do dialysis at home.  He has assistance from his brother and father but reports plans to be trained in the near future to do it by himself as he wants to be more independent.  He reports continued positive daily routine and structure with balance.  He also reports continued positive self-care regarding eating, sleeping, and exercise.  Patient also continues to use meditation and engages in hobbies such as art and music.   Patient is pleased with his progress and expresses confidence in his ability to manage stress and anxiety.   Suicidal/Homicidal: Nowithout intent/plan  Therapist Response: Reviewed symptoms, praised and reinforced patient's use of helpful coping strategies to manage recent panic attack, reviewed psychoeducation on the anxiety and stress response, reviewed the rationale for using deep breathing or PMR to trigger a relaxation response, assist patient identify triggers of recent panic attack, discussed ways to pace self when tired, facilitated patient expressing thoughts and feelings about home dialysis, validated feelings, praised and reinforced patient's continued behavioral activation/improved self-care/focus, praised and reinforced patient's use of relaxation and coping techniques,  Plan: Patient will call should he need another appointment  Diagnosis: Axis I: Schizoaffective Disorder    Axis II: No diagnosis    Adah Salvage, LCSW 06/04/2020

## 2020-06-04 NOTE — Telephone Encounter (Signed)
Therapist attempted to contact patient twice via text through care agility platform for scheduled appointment, no response.  Therapist called patient, left message indicating attempt, and requesting patient call office. 

## 2020-06-23 ENCOUNTER — Telehealth (HOSPITAL_COMMUNITY): Payer: Self-pay | Admitting: Psychiatry

## 2020-06-23 NOTE — Telephone Encounter (Signed)
Called to schedule f/u appt not albe to leave voicemail due to no mailbox being set up

## 2020-10-17 ENCOUNTER — Other Ambulatory Visit (HOSPITAL_COMMUNITY): Payer: Self-pay | Admitting: Psychiatry

## 2020-10-19 NOTE — Telephone Encounter (Signed)
Call for appt

## 2020-11-02 ENCOUNTER — Other Ambulatory Visit (HOSPITAL_COMMUNITY): Payer: Self-pay | Admitting: Psychiatry

## 2020-11-03 ENCOUNTER — Telehealth (HOSPITAL_COMMUNITY): Payer: Self-pay | Admitting: *Deleted

## 2020-11-03 ENCOUNTER — Other Ambulatory Visit: Payer: Self-pay

## 2020-11-03 ENCOUNTER — Other Ambulatory Visit (HOSPITAL_COMMUNITY): Payer: Self-pay | Admitting: Psychiatry

## 2020-11-03 ENCOUNTER — Telehealth (HOSPITAL_COMMUNITY): Payer: 59 | Admitting: Psychiatry

## 2020-11-03 ENCOUNTER — Encounter (HOSPITAL_COMMUNITY): Payer: Self-pay | Admitting: *Deleted

## 2020-11-03 MED ORDER — TRAZODONE HCL 150 MG PO TABS: 150.0000 mg | ORAL_TABLET | Freq: Every day | ORAL | 2 refills | Status: DC

## 2020-11-03 MED ORDER — OLANZAPINE 20 MG PO TABS: ORAL_TABLET | ORAL | 1 refills | Status: DC

## 2020-11-03 MED ORDER — VENLAFAXINE HCL ER 150 MG PO CP24: 300.0000 mg | ORAL_CAPSULE | Freq: Every day | ORAL | 1 refills | Status: DC

## 2020-11-03 MED ORDER — HYDROXYZINE HCL 50 MG PO TABS: 50.0000 mg | ORAL_TABLET | Freq: Every day | ORAL | 2 refills | Status: DC

## 2020-11-03 NOTE — Telephone Encounter (Signed)
Opened in Error.

## 2020-11-07 ENCOUNTER — Other Ambulatory Visit (HOSPITAL_COMMUNITY): Payer: Self-pay | Admitting: Psychiatry

## 2020-12-07 ENCOUNTER — Other Ambulatory Visit (HOSPITAL_COMMUNITY): Payer: Self-pay | Admitting: Psychiatry

## 2021-02-08 ENCOUNTER — Encounter (HOSPITAL_COMMUNITY): Payer: Self-pay | Admitting: Psychiatry

## 2021-02-08 ENCOUNTER — Other Ambulatory Visit: Payer: Self-pay

## 2021-02-08 ENCOUNTER — Telehealth (INDEPENDENT_AMBULATORY_CARE_PROVIDER_SITE_OTHER): Payer: 59 | Admitting: Psychiatry

## 2021-02-08 DIAGNOSIS — F251 Schizoaffective disorder, depressive type: Secondary | ICD-10-CM

## 2021-02-08 MED ORDER — HYDROXYZINE HCL 50 MG PO TABS: 50.0000 mg | ORAL_TABLET | Freq: Every day | ORAL | 2 refills | Status: DC

## 2021-02-08 MED ORDER — OLANZAPINE 20 MG PO TABS: ORAL_TABLET | ORAL | 1 refills | Status: DC

## 2021-02-08 MED ORDER — VENLAFAXINE HCL ER 150 MG PO CP24: 300.0000 mg | ORAL_CAPSULE | Freq: Every day | ORAL | 1 refills | Status: DC

## 2021-02-08 MED ORDER — TRAZODONE HCL 150 MG PO TABS: 150.0000 mg | ORAL_TABLET | Freq: Every day | ORAL | 1 refills | Status: DC

## 2021-02-08 MED ORDER — GABAPENTIN 300 MG PO CAPS: ORAL_CAPSULE | ORAL | 1 refills | Status: DC
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
## 2021-04-14 ENCOUNTER — Other Ambulatory Visit (HOSPITAL_COMMUNITY): Payer: Self-pay | Admitting: Psychiatry

## 2021-05-16 ENCOUNTER — Other Ambulatory Visit (HOSPITAL_COMMUNITY): Payer: Self-pay | Admitting: Psychiatry

## 2021-07-07 ENCOUNTER — Other Ambulatory Visit (HOSPITAL_COMMUNITY): Payer: Self-pay | Admitting: Psychiatry

## 2021-08-25 ENCOUNTER — Encounter (HOSPITAL_COMMUNITY): Payer: Self-pay | Admitting: Psychiatry

## 2021-08-25 ENCOUNTER — Telehealth (INDEPENDENT_AMBULATORY_CARE_PROVIDER_SITE_OTHER): Payer: 59 | Admitting: Psychiatry

## 2021-08-25 DIAGNOSIS — F251 Schizoaffective disorder, depressive type: Secondary | ICD-10-CM

## 2021-08-25 MED ORDER — VENLAFAXINE HCL ER 150 MG PO CP24: 300.0000 mg | ORAL_CAPSULE | Freq: Every day | ORAL | 1 refills | Status: DC

## 2021-08-25 MED ORDER — GABAPENTIN 300 MG PO CAPS: ORAL_CAPSULE | ORAL | 2 refills | Status: DC

## 2021-08-25 MED ORDER — OLANZAPINE 20 MG PO TABS
ORAL_TABLET | ORAL | 1 refills | Status: DC
Start: 2021-08-25 — End: 2022-02-09

## 2021-08-25 MED ORDER — HYDROXYZINE HCL 50 MG PO TABS
50.0000 mg | ORAL_TABLET | Freq: Every day | ORAL | 1 refills | Status: DC
Start: 2021-08-25 — End: 2022-03-09

## 2021-08-25 MED ORDER — TRAZODONE HCL 150 MG PO TABS
150.0000 mg | ORAL_TABLET | Freq: Every day | ORAL | 1 refills | Status: DC
Start: 2021-08-25 — End: 2022-03-09
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
## 2021-11-21 ENCOUNTER — Other Ambulatory Visit (HOSPITAL_COMMUNITY): Payer: Self-pay | Admitting: Psychiatry

## 2021-11-22 NOTE — Telephone Encounter (Signed)
Please send in PA

## 2022-02-09 ENCOUNTER — Other Ambulatory Visit (HOSPITAL_COMMUNITY): Payer: Self-pay | Admitting: Psychiatry

## 2022-02-09 NOTE — Telephone Encounter (Signed)
Call for appt

## 2022-02-27 ENCOUNTER — Other Ambulatory Visit (HOSPITAL_COMMUNITY): Payer: Self-pay | Admitting: Psychiatry

## 2022-02-28 NOTE — Telephone Encounter (Signed)
Call for appt

## 2022-03-08 NOTE — Telephone Encounter (Signed)
Scheduled 03/09/22

## 2022-03-09 ENCOUNTER — Telehealth (INDEPENDENT_AMBULATORY_CARE_PROVIDER_SITE_OTHER): Payer: 59 | Admitting: Psychiatry

## 2022-03-09 ENCOUNTER — Encounter (HOSPITAL_COMMUNITY): Payer: Self-pay | Admitting: Psychiatry

## 2022-03-09 DIAGNOSIS — F251 Schizoaffective disorder, depressive type: Secondary | ICD-10-CM

## 2022-03-09 MED ORDER — OLANZAPINE 20 MG PO TABS: ORAL_TABLET | ORAL | 1 refills | Status: DC

## 2022-03-09 MED ORDER — TRAZODONE HCL 150 MG PO TABS: 150.0000 mg | ORAL_TABLET | Freq: Every day | ORAL | 1 refills | Status: DC

## 2022-03-09 MED ORDER — VENLAFAXINE HCL ER 150 MG PO CP24: 300.0000 mg | ORAL_CAPSULE | Freq: Every day | ORAL | 1 refills | Status: DC

## 2022-03-09 MED ORDER — HYDROXYZINE HCL 50 MG PO TABS: 50.0000 mg | ORAL_TABLET | Freq: Every day | ORAL | 1 refills | Status: DC

## 2022-03-09 MED ORDER — GABAPENTIN 300 MG PO CAPS: ORAL_CAPSULE | ORAL | 2 refills | Status: DC
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
## 2022-06-04 ENCOUNTER — Other Ambulatory Visit (HOSPITAL_COMMUNITY): Payer: Self-pay | Admitting: Psychiatry

## 2022-06-09 ENCOUNTER — Telehealth (HOSPITAL_COMMUNITY): Payer: PRIVATE HEALTH INSURANCE | Admitting: Psychiatry

## 2022-07-01 ENCOUNTER — Telehealth (INDEPENDENT_AMBULATORY_CARE_PROVIDER_SITE_OTHER): Payer: 59 | Admitting: Psychiatry

## 2022-07-01 ENCOUNTER — Encounter (HOSPITAL_COMMUNITY): Payer: Self-pay | Admitting: Psychiatry

## 2022-07-01 DIAGNOSIS — F251 Schizoaffective disorder, depressive type: Secondary | ICD-10-CM

## 2022-07-01 MED ORDER — HYDROXYZINE HCL 50 MG PO TABS: 50.0000 mg | ORAL_TABLET | Freq: Every day | ORAL | 1 refills | Status: DC

## 2022-07-01 MED ORDER — TRAZODONE HCL 150 MG PO TABS: 150.0000 mg | ORAL_TABLET | Freq: Every day | ORAL | 1 refills | Status: DC

## 2022-07-01 MED ORDER — OLANZAPINE 20 MG PO TABS: ORAL_TABLET | ORAL | 1 refills | Status: DC

## 2022-07-01 MED ORDER — VENLAFAXINE HCL ER 150 MG PO CP24: 300.0000 mg | ORAL_CAPSULE | Freq: Every day | ORAL | 1 refills | Status: DC

## 2022-07-01 MED ORDER — GABAPENTIN 300 MG PO CAPS: ORAL_CAPSULE | ORAL | 2 refills | Status: DC

## 2022-07-01 NOTE — Patient Instructions (Signed)
The patient returns for follow
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
## 2022-09-11 ENCOUNTER — Other Ambulatory Visit (HOSPITAL_COMMUNITY): Payer: Self-pay | Admitting: Psychiatry

## 2022-10-24 ENCOUNTER — Telehealth (HOSPITAL_COMMUNITY): Payer: Self-pay

## 2022-10-24 NOTE — Telephone Encounter (Signed)
LMOM

## 2022-10-24 NOTE — Telephone Encounter (Signed)
Mother LVM asking to speak with Nurse or Dr. [name] regarding pt. She states it is a medical concern. No other information given.

## 2022-10-25 ENCOUNTER — Telehealth (HOSPITAL_COMMUNITY): Payer: Self-pay | Admitting: *Deleted

## 2022-10-25 NOTE — Telephone Encounter (Signed)
Opened in Error.

## 2022-10-25 NOTE — Telephone Encounter (Signed)
Patient mother called to say patient mental help is going down and would like for patient to see Dr. [name] face to face for an appt.

## 2022-10-26 ENCOUNTER — Ambulatory Visit (INDEPENDENT_AMBULATORY_CARE_PROVIDER_SITE_OTHER): Payer: PRIVATE HEALTH INSURANCE | Admitting: Psychiatry

## 2022-10-26 ENCOUNTER — Encounter (HOSPITAL_COMMUNITY): Payer: Self-pay | Admitting: Psychiatry

## 2022-10-26 VITALS — BP 144/84 | HR 74 | Wt 147.0 lb

## 2022-10-26 DIAGNOSIS — F251 Schizoaffective disorder, depressive type: Secondary | ICD-10-CM | POA: Diagnosis not present

## 2022-10-26 MED ORDER — TRAZODONE HCL 150 MG PO TABS: 150.0000 mg | ORAL_TABLET | Freq: Every day | ORAL | 1 refills | Status: DC

## 2022-10-26 MED ORDER — OLANZAPINE 20 MG PO TABS: ORAL_TABLET | ORAL | 1 refills | Status: DC

## 2022-10-26 MED ORDER — HYDROXYZINE HCL 50 MG PO TABS: 50.0000 mg | ORAL_TABLET | Freq: Every day | ORAL | 1 refills | Status: DC

## 2022-10-26 MED ORDER — GABAPENTIN 300 MG PO CAPS: ORAL_CAPSULE | ORAL | 2 refills | Status: DC

## 2022-10-26 MED ORDER — VENLAFAXINE HCL ER 150 MG PO CP24: 300.0000 mg | ORAL_CAPSULE | Freq: Every day | ORAL | 1 refills | Status: DC
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
## 2022-12-17 ENCOUNTER — Other Ambulatory Visit (HOSPITAL_COMMUNITY): Payer: Self-pay | Admitting: Psychiatry

## 2023-01-24 ENCOUNTER — Ambulatory Visit (HOSPITAL_COMMUNITY): Payer: PRIVATE HEALTH INSURANCE | Admitting: Psychiatry

## 2023-01-27 ENCOUNTER — Encounter (HOSPITAL_COMMUNITY): Payer: Self-pay | Admitting: Psychiatry

## 2023-01-27 ENCOUNTER — Telehealth (INDEPENDENT_AMBULATORY_CARE_PROVIDER_SITE_OTHER): Payer: 59 | Admitting: Psychiatry

## 2023-01-27 DIAGNOSIS — F251 Schizoaffective disorder, depressive type: Secondary | ICD-10-CM | POA: Diagnosis not present

## 2023-01-27 MED ORDER — TRAZODONE HCL 150 MG PO TABS: 150.0000 mg | ORAL_TABLET | Freq: Every day | ORAL | 1 refills | Status: DC

## 2023-01-27 MED ORDER — GABAPENTIN 300 MG PO CAPS: ORAL_CAPSULE | ORAL | 2 refills | Status: DC

## 2023-01-27 MED ORDER — VENLAFAXINE HCL ER 150 MG PO CP24: 300.0000 mg | ORAL_CAPSULE | Freq: Every day | ORAL | 1 refills | Status: DC

## 2023-01-27 MED ORDER — HYDROXYZINE HCL 50 MG PO TABS: 50.0000 mg | ORAL_TABLET | Freq: Every day | ORAL | 1 refills | Status: DC

## 2023-01-27 MED ORDER — OLANZAPINE 20 MG PO TABS: ORAL_TABLET | ORAL | 1 refills | Status: DC
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
## 2023-03-19 ENCOUNTER — Other Ambulatory Visit (HOSPITAL_COMMUNITY): Payer: Self-pay | Admitting: Psychiatry

## 2023-05-23 ENCOUNTER — Telehealth (HOSPITAL_COMMUNITY): Payer: Self-pay | Admitting: *Deleted

## 2023-05-23 NOTE — Telephone Encounter (Signed)
 [facility] called stating that patient is trying to get a transplant and they are needing a letter. Per [facility], they are trying to get a letter that stating from provider's perspective if patient is stabled and is mental stable or okay with patient getting his transplant and continuous counseling.  Something that'll clear patient for the surgery when they get to his name for transplant. He's not scheduled right now but is on the transplant list. [facility] [phone]. Fax number is [phone].

## 2023-05-23 NOTE — Telephone Encounter (Signed)
 He needs to make an appointment to discuss, preferably in person

## 2023-06-07 ENCOUNTER — Ambulatory Visit (HOSPITAL_COMMUNITY): Payer: 59 | Admitting: Psychiatry

## 2023-06-14 ENCOUNTER — Ambulatory Visit (INDEPENDENT_AMBULATORY_CARE_PROVIDER_SITE_OTHER): Payer: 59 | Admitting: Psychiatry

## 2023-06-14 ENCOUNTER — Encounter (HOSPITAL_COMMUNITY): Payer: Self-pay | Admitting: Psychiatry

## 2023-06-14 VITALS — BP 129/71 | HR 67 | Ht 67.75 in | Wt 151.0 lb

## 2023-06-14 DIAGNOSIS — F251 Schizoaffective disorder, depressive type: Secondary | ICD-10-CM

## 2023-06-14 MED ORDER — TRAZODONE HCL 150 MG PO TABS: 150.0000 mg | ORAL_TABLET | Freq: Every day | ORAL | 1 refills | Status: DC

## 2023-06-14 MED ORDER — VENLAFAXINE HCL ER 150 MG PO CP24: 300.0000 mg | ORAL_CAPSULE | Freq: Every day | ORAL | 1 refills | Status: DC

## 2023-06-14 MED ORDER — GABAPENTIN 300 MG PO CAPS: ORAL_CAPSULE | ORAL | 2 refills | Status: DC

## 2023-06-14 MED ORDER — HYDROXYZINE HCL 50 MG PO TABS: 50.0000 mg | ORAL_TABLET | Freq: Every day | ORAL | 1 refills | Status: DC

## 2023-06-14 MED ORDER — OLANZAPINE 20 MG PO TABS: ORAL_TABLET | ORAL | 1 refills | Status: DC
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
## 2023-06-15 ENCOUNTER — Encounter (HOSPITAL_COMMUNITY): Payer: Self-pay | Admitting: Psychiatry

## 2023-07-09 ENCOUNTER — Other Ambulatory Visit (HOSPITAL_COMMUNITY): Payer: Self-pay | Admitting: Psychiatry

## 2023-09-13 ENCOUNTER — Encounter (HOSPITAL_COMMUNITY): Payer: Self-pay | Admitting: Psychiatry

## 2023-09-13 ENCOUNTER — Telehealth (HOSPITAL_COMMUNITY): Payer: 59 | Admitting: Psychiatry

## 2023-09-13 DIAGNOSIS — F251 Schizoaffective disorder, depressive type: Secondary | ICD-10-CM | POA: Diagnosis not present

## 2023-09-13 MED ORDER — GABAPENTIN 300 MG PO CAPS: ORAL_CAPSULE | ORAL | 2 refills | Status: DC

## 2023-09-13 MED ORDER — HYDROXYZINE HCL 50 MG PO TABS
50.0000 mg | ORAL_TABLET | Freq: Every day | ORAL | 1 refills | Status: DC
Start: 2023-09-13 — End: 2024-03-21

## 2023-09-13 MED ORDER — VENLAFAXINE HCL ER 150 MG PO CP24: 300.0000 mg | ORAL_CAPSULE | Freq: Every day | ORAL | 1 refills | Status: DC

## 2023-09-13 MED ORDER — OLANZAPINE 20 MG PO TABS: ORAL_TABLET | ORAL | 1 refills | Status: DC

## 2023-09-13 MED ORDER — TRAZODONE HCL 150 MG PO TABS: 150.0000 mg | ORAL_TABLET | Freq: Every day | ORAL | 1 refills | Status: DC

## 2023-09-13 NOTE — Progress Notes (Signed)
 Virtual Visit via Video Note  I connected with Luis Jones on 09/13/23 at  3:00 PM EDT by a video enabled telemedicine application and verified that I am speaking with the correct person using two identifiers.  Location: Patient: home Provider: office   I discussed the limitations of evaluation and management by telemedicine and the availability of in person appointments. The patient expressed understanding and agreed to proceed.     I discussed the assessment and treatment plan with the patient. The patient was provided an opportunity to ask questions and all were answered. The patient agreed with the plan and demonstrated an understanding of the instructions.   The patient was advised to call back or seek an in-person evaluation if the symptoms worsen or if the condition fails to improve as anticipated.  I provided 20 minutes of non-face-to-face time during this encounter.   Luis Annas, MD  [facility] MD/PA/NP OP Progress Note  09/13/2023 3:11 PM Luis Jones  MRN:  [id number]  Chief Complaint:  Chief Complaint  Patient presents with   Depression   Anxiety   Follow-up   HPI: This patient is a 28 year old black male who lives with both parents and older brother in [place] . He did not finish high school. He was initially admitted to behavioral [facility] hospital in June 2013 after a psychotic break.   The patient returns for follow-up after 3 months regarding his depression anxiety and past history of psychotic symptoms.  He states that he is doing fairly well.  As usual he does not say much and answers questions in monosyllables.  He is very soft-spoken.  He denies auditory visual hallucinations.  He is helping his mother around the house.  He still has the same girlfriend that he primarily communicates with online.  He states that his mood has been good and he denies significant depression anxiety or thoughts of self-harm.  He is sleeping well.  He states that he is  often tired but this has more to do with his renal failure and dialysis.  He is still on the kidney transplant list. Visit Diagnosis:    ICD-10-CM   1. Schizoaffective disorder, depressive type (HCC)  F25.1       Past Psychiatric History: The patient has had several hospitalizations for psychosis and agitation in the past.  Often precipitated by substance abuse.  He is not using drugs at this time.   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, 4 surgeries over the years    Family Psychiatric History: See below  Family History:  Family History  Problem Relation Age of Onset   Bipolar disorder Mother    Anxiety disorder Mother    Seizures Mother    Drug abuse Maternal Aunt    Depression Maternal Aunt    OCD Father    Autism spectrum disorder Brother    Depression Maternal Uncle    Bipolar disorder Paternal Aunt    ADD / ADHD Neg Hx    Alcohol abuse Neg Hx    Dementia Neg Hx    Paranoid behavior Neg Hx    Schizophrenia Neg Hx    Sexual abuse Neg Hx  Physical abuse Neg Hx     Social History:  Social History   Socioeconomic History   Marital status: Single    Spouse name: Not on file   Number of children: Not on file   Years of education: Not on file   Highest education level: Not on file  Occupational History   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 [employer]  Tobacco Use   Smoking status: Heavy Smoker    Current packs/day: 1.00    Average packs/day: 1 pack/day for 7.0 years (7.0 ttl pk-yrs)    Types: Cigarettes   Smokeless tobacco: Former  Substance and Sexual Activity   Alcohol use: No   Drug use: No    Types: Benzodiazepines, Hydrocodone, MDMA (Ecstacy), Marijuana    Comment: past use , last  used marijuana about 2 months ago   Sexual activity: Yes    Partners: Female    Birth control/protection: Condom  Other Topics Concern   Not on file  Social History Narrative   Not on file   Social Drivers of Health   Financial Resource Strain: Not on file  Food Insecurity: Not on file  Transportation Needs: Not on file  Physical Activity: Not on file  Stress: Not on file  Social Connections: Not on file    Allergies:  Allergies  Allergen Reactions   Wellbutrin  [Bupropion ] Other (See Comments)    Chest pains at night when trying to go to sleep and increase in BP   Ibuprofen Other (See Comments)    As patient has 1 functional kidney,70%    Metabolic Disorder Labs: Lab Results  Component Value Date   HGBA1C 5.3 08/19/2014   MPG 105 08/19/2014   MPG 91 04/24/2013   No results found for: "PROLACTIN" Lab Results  Component Value Date   CHOL 247 (H) 08/19/2014   TRIG 334 (H) 08/19/2014   HDL 31 08/19/2014   CHOLHDL 8.0 08/19/2014   VLDL 67 (H) 08/19/2014   LDLCALC 149 (H) 08/19/2014   LDLCALC 136 (H) 04/24/2013   Lab Results  Component Value Date   TSH 1.114 04/24/2013   TSH 1.059 02/09/2013    Therapeutic Level Labs: No results found for: "LITHIUM" No results found for: "VALPROATE" No results found for: "CBMZ"  Current Medications: Current Outpatient Medications  Medication Sig Dispense Refill   allopurinol (ZYLOPRIM) 100 MG tablet Take 100 mg by mouth daily.  3   atorvastatin  (LIPITOR) 20 MG tablet Take 20 mg by mouth daily.     gabapentin  (NEURONTIN ) 300 MG capsule TAKE 1 CAPSULE BY MOUTH EVERYDAY AT BEDTIME 30 capsule 2   hydrOXYzine  (ATARAX ) 50 MG tablet Take 1 tablet (50 mg total) by mouth daily. 90 tablet 1   OLANZapine  (ZYPREXA ) 20 MG tablet TAKE 1 TABLET BY MOUTH EVERYDAY AT BEDTIME 90 tablet 1   traZODone  (DESYREL ) 150 MG tablet Take 1 tablet (150 mg total) by mouth at bedtime. 90 tablet 1   venlafaxine  XR (EFFEXOR -XR) 150 MG 24 hr capsule Take 2  capsules (300 mg total) by mouth daily with breakfast. 180 capsule 1   No current facility-administered medications for this visit.     Musculoskeletal: Strength & Muscle Tone: within normal limits Gait & Station: normal Patient leans: N/A  Psychiatric Specialty Exam: Review of Systems  All other systems reviewed and are negative.   There were no vitals taken for this visit.There is no height or weight on file to calculate BMI.  General Appearance: Casual  and Fairly Groomed  Eye Contact:  Fair  Speech:  Clear and Coherent  Volume:  Decreased  Mood:  Euthymic  Affect:  Flat  Thought Process:  Goal Directed  Orientation:  Full (Time, Place, and Person)  Thought Content: WDL   Suicidal Thoughts:  No  Homicidal Thoughts:  No  Memory:  Immediate;   Good Recent;   Good Remote;   Fair  Judgement:  Good  Insight:  Fair  Psychomotor Activity:  Decreased  Concentration:  Concentration: Good and Attention Span: Good  Recall:  Good  Fund of Knowledge: Good  Language: Good  Akathisia:  No  Handed:  Right  AIMS (if indicated): not done  Assets:  Communication Skills Desire for Improvement Resilience Social Support  ADL's:  Intact  Cognition: WNL  Sleep:  Good   Screenings: AUDIT    Flowsheet Row Ad[facility] (Discharged) [facility] Admission (Discharged[facility]  Alcohol Use Disorder Identification Test Final Score (AUDIT) 4 6      PHQ2-9    Flowsheet Row Office Visit from 10/26/2022 in [facility] at [place] Video Visit from 08/25/2021 in [facility] at [place] Video Visit from 02/08/2021 in [facility] at [facility] Total Score 1 0 0  PHQ-9 Total Score 9 -- --      Flowsheet Row Office Visit from 10/26/2022 in [facility] at [place] Video Visit from 08/25/2021 in [facility] at [place] Video Visit from 02/08/2021 in [facility] at [place]  C-SSRS RISK CATEGORY No Risk No Risk No Risk        Assessment and Plan: This patient is a 28 year old male with a history of schizoaffective disorder and end-stage renal disease.  He still feels his medications are helpful so he will continue Effexor  XR 300 mg daily for depression, gabapentin  300 mg at bedtime for anxiety and sleep, trazodone  150 mg at bedtime for sleep, olanzapine  20 mg at bedtime for psychotic symptoms and hydroxyzine  50 mg at bedtime as needed for anxiety.  He will return to see me in 3 months  Collaboration of Care: Collaboration of Care: Other provider involved in patient's care AEB notes are shared with the renal transplant team on the epic system  Patient/Guardian was advised Release of Information must be obtained prior to any record release in order to collaborate their care with an outside provider. Patient/Guardian was advised if they have not already done so to contact the registration department to sign all necessary forms in order for us  to release information regarding their care.   Consent: Patient/Guardian gives verbal consent for treatment and assignment of benefits for services provided during this visit. Patient/Guardian expressed understanding and agreed to proceed.    Luis Annas, MD 09/13/2023, 3:11 PM

## 2024-01-21 ENCOUNTER — Other Ambulatory Visit (HOSPITAL_COMMUNITY): Payer: Self-pay | Admitting: Psychiatry

## 2024-01-22 NOTE — Telephone Encounter (Signed)
 Call for appt

## 2024-03-21 ENCOUNTER — Other Ambulatory Visit (HOSPITAL_COMMUNITY): Payer: Self-pay | Admitting: Psychiatry

## 2024-03-21 NOTE — Telephone Encounter (Signed)
 Call for appt

## 2024-04-19 ENCOUNTER — Other Ambulatory Visit (HOSPITAL_COMMUNITY): Payer: Self-pay | Admitting: Psychiatry

## 2024-04-19 NOTE — Telephone Encounter (Signed)
 Call for appt

## 2024-05-03 ENCOUNTER — Other Ambulatory Visit (HOSPITAL_COMMUNITY): Payer: Self-pay | Admitting: Psychiatry

## 2024-05-03 NOTE — Telephone Encounter (Signed)
 Call for appt

## 2024-05-04 ENCOUNTER — Other Ambulatory Visit (HOSPITAL_COMMUNITY): Payer: Self-pay | Admitting: Psychiatry

## 2024-05-06 NOTE — Telephone Encounter (Signed)
 Pt needs appt.

## 2024-05-22 NOTE — Telephone Encounter (Signed)
 Called no answer left vm

## 2024-06-26 ENCOUNTER — Telehealth (HOSPITAL_COMMUNITY): Admitting: Psychiatry
